# Patient Record
Sex: Male | Born: 1935
Health system: Southern US, Community
[De-identification: ages and names within clinical notes are randomized; demographics above are authoritative.]

## PROBLEM LIST (undated history)

## (undated) DIAGNOSIS — I5022 Chronic systolic (congestive) heart failure: Secondary | ICD-10-CM

## (undated) DIAGNOSIS — Z95810 Presence of automatic (implantable) cardiac defibrillator: Secondary | ICD-10-CM

## (undated) DIAGNOSIS — I10 Essential (primary) hypertension: Secondary | ICD-10-CM

## (undated) DIAGNOSIS — E785 Hyperlipidemia, unspecified: Secondary | ICD-10-CM

## (undated) DIAGNOSIS — N189 Chronic kidney disease, unspecified: Secondary | ICD-10-CM

## (undated) DIAGNOSIS — I2510 Atherosclerotic heart disease of native coronary artery without angina pectoris: Secondary | ICD-10-CM

## (undated) DIAGNOSIS — I513 Intracardiac thrombosis, not elsewhere classified: Secondary | ICD-10-CM

## (undated) DIAGNOSIS — Z950 Presence of cardiac pacemaker: Secondary | ICD-10-CM

## (undated) DIAGNOSIS — I472 Ventricular tachycardia, unspecified: Secondary | ICD-10-CM

## (undated) DIAGNOSIS — I255 Ischemic cardiomyopathy: Secondary | ICD-10-CM

## (undated) HISTORY — DX: Ventricular tachycardia: I47.2

## (undated) HISTORY — DX: Presence of cardiac pacemaker: Z95.0

## (undated) HISTORY — DX: Presence of automatic (implantable) cardiac defibrillator: Z95.810

## (undated) HISTORY — PX: ANGIOPLASTY / STENTING FEMORAL: SUR30

## (undated) HISTORY — DX: Hyperlipidemia, unspecified: E78.5

## (undated) HISTORY — DX: Atherosclerotic heart disease of native coronary artery without angina pectoris: I25.10

## (undated) HISTORY — DX: Chronic systolic (congestive) heart failure: I50.22

## (undated) HISTORY — PX: CARDIAC DEFIBRILLATOR PLACEMENT: SHX171

## (undated) HISTORY — DX: Ventricular tachycardia, unspecified: I47.20

## (undated) HISTORY — DX: Ischemic cardiomyopathy: I25.5

## (undated) HISTORY — DX: Chronic kidney disease, unspecified: N18.9

## (undated) HISTORY — DX: Essential (primary) hypertension: I10

---

## 1997-10-20 ENCOUNTER — Other Ambulatory Visit: Admission: RE | Admit: 1997-10-20 | Discharge: 1997-10-20 | Payer: Self-pay | Admitting: Urology

## 1999-07-05 ENCOUNTER — Inpatient Hospital Stay (HOSPITAL_COMMUNITY): Admission: AD | Admit: 1999-07-05 | Discharge: 1999-07-08 | Payer: Self-pay | Admitting: Cardiology

## 1999-07-05 ENCOUNTER — Encounter: Payer: Self-pay | Admitting: Cardiology

## 1999-08-21 ENCOUNTER — Inpatient Hospital Stay (HOSPITAL_COMMUNITY): Admission: EM | Admit: 1999-08-21 | Discharge: 1999-08-24 | Payer: Self-pay | Admitting: Internal Medicine

## 1999-08-22 ENCOUNTER — Encounter: Payer: Self-pay | Admitting: Internal Medicine

## 2000-01-22 ENCOUNTER — Inpatient Hospital Stay (HOSPITAL_COMMUNITY): Admission: EM | Admit: 2000-01-22 | Discharge: 2000-01-24 | Payer: Self-pay | Admitting: Emergency Medicine

## 2000-01-22 ENCOUNTER — Encounter: Payer: Self-pay | Admitting: Emergency Medicine

## 2000-01-24 ENCOUNTER — Encounter: Payer: Self-pay | Admitting: Cardiology

## 2003-09-29 ENCOUNTER — Inpatient Hospital Stay (HOSPITAL_COMMUNITY): Admission: RE | Admit: 2003-09-29 | Discharge: 2003-09-30 | Payer: Self-pay | Admitting: Internal Medicine

## 2003-10-05 ENCOUNTER — Inpatient Hospital Stay (HOSPITAL_COMMUNITY): Admission: RE | Admit: 2003-10-05 | Discharge: 2003-10-06 | Payer: Self-pay | Admitting: Internal Medicine

## 2004-02-10 ENCOUNTER — Ambulatory Visit: Payer: Self-pay | Admitting: Internal Medicine

## 2005-03-27 ENCOUNTER — Ambulatory Visit: Payer: Self-pay | Admitting: Internal Medicine

## 2005-05-25 IMAGING — CR DG CHEST 2V
2 series · 2 of 2 positions shown · non-contrast
Comparison: 09/30/03.

CLINICAL DATA: 69 year-old male status post AICD lead replacement
TWO VIEW CHEST 10/06/03

[view not recorded (1 of 2)]
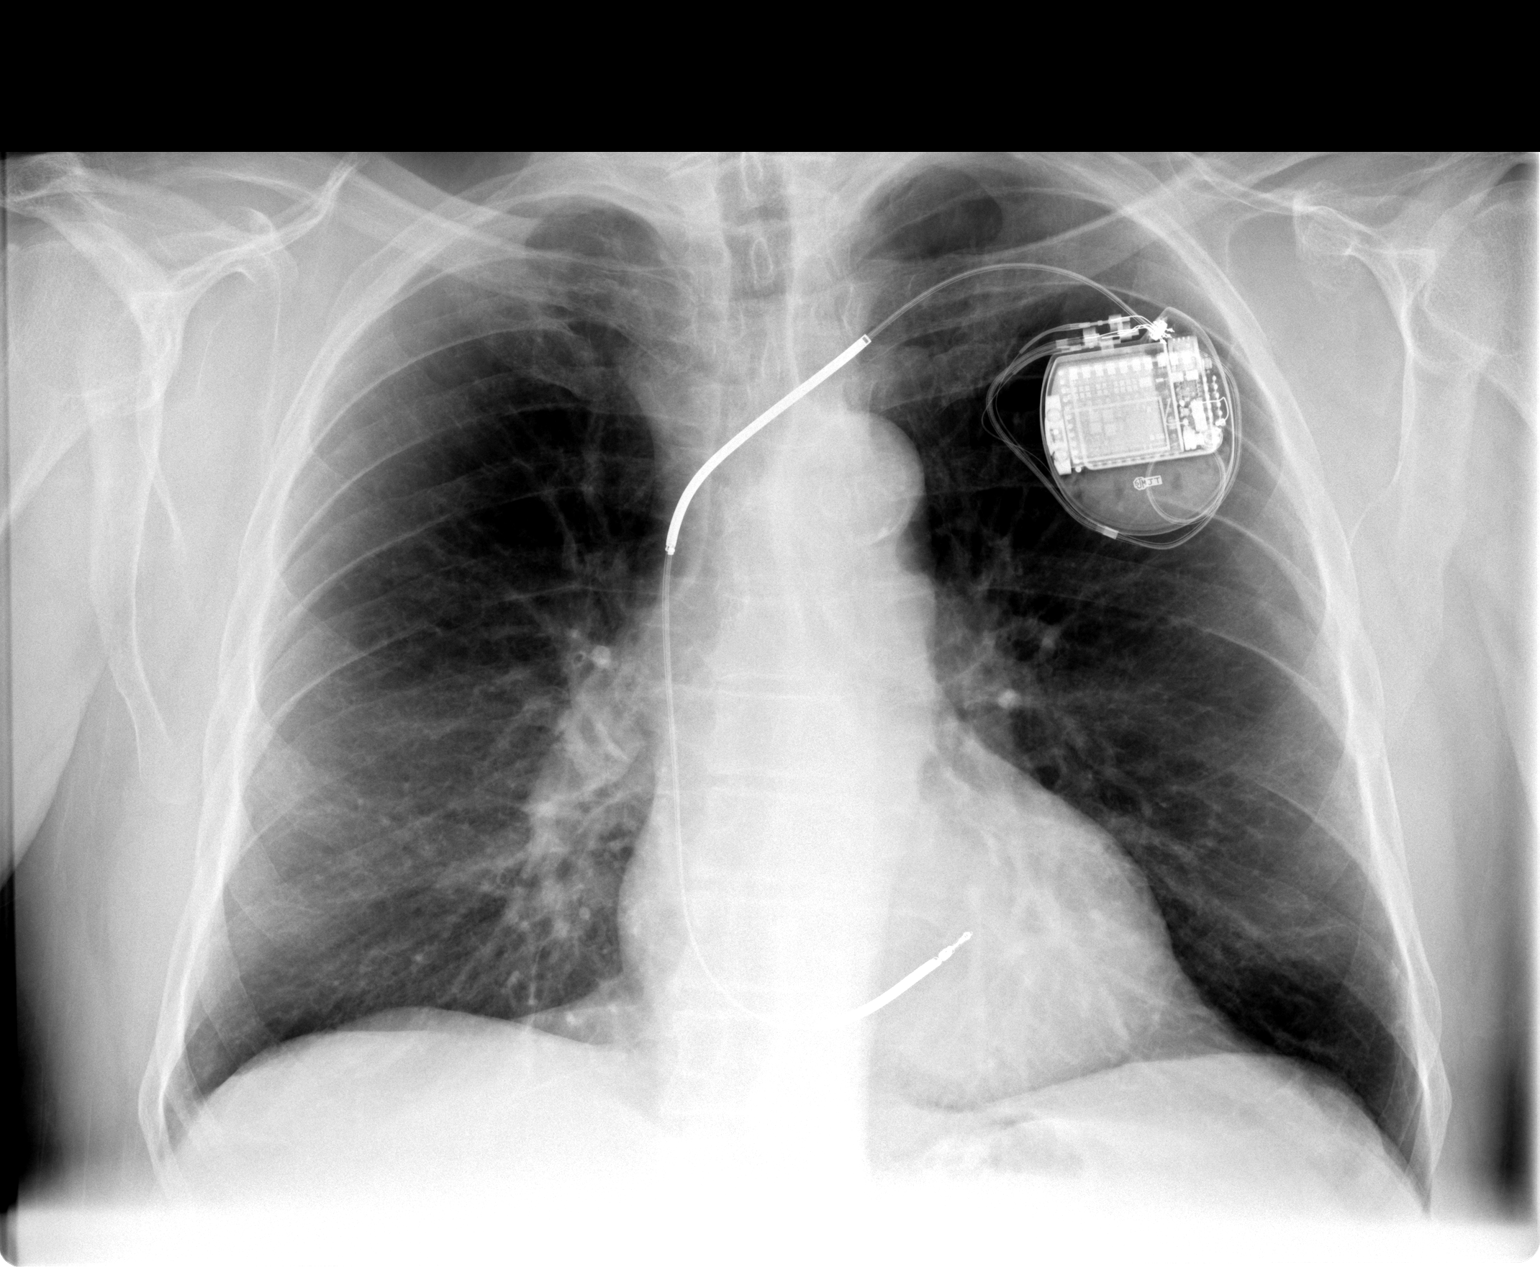

[view not recorded (2 of 2)]
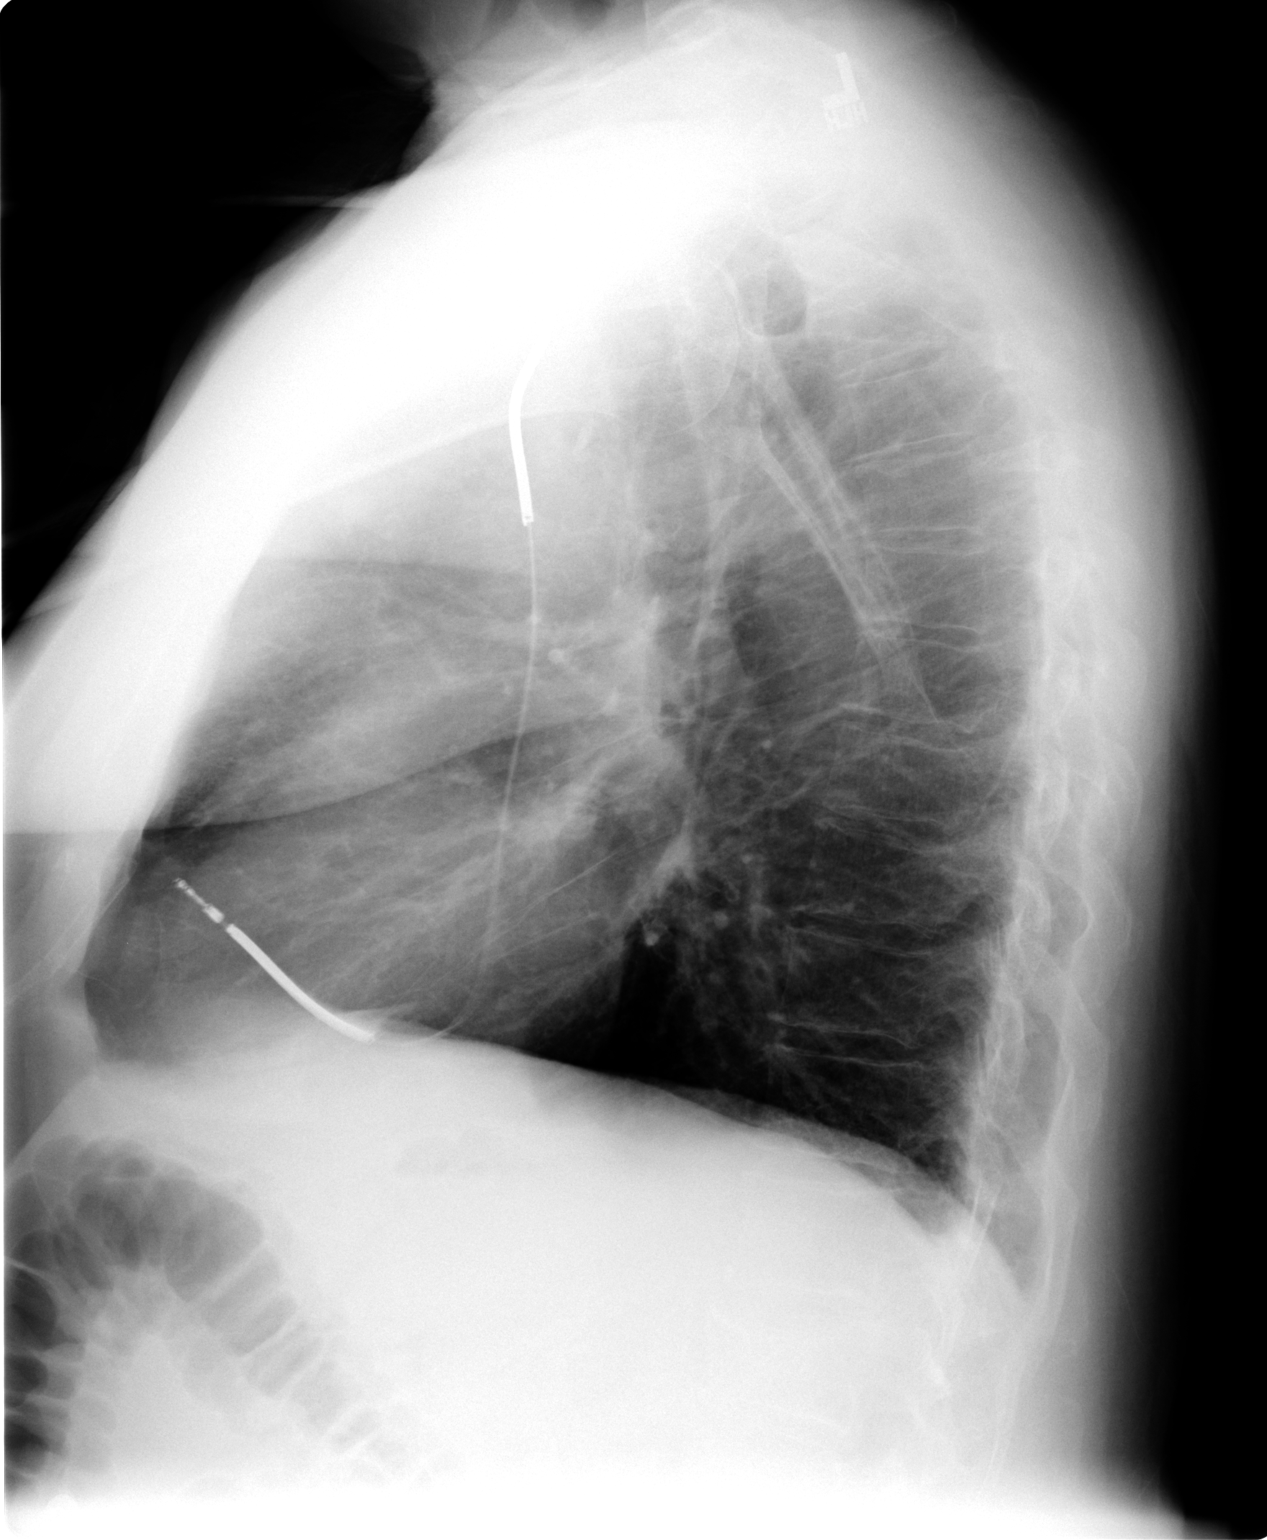

[2 of 2 positions shown; findings below may reference images not displayed]

FINDINGS: The AICD lead has a slightly different course with the single lead  projecting more superiorly over the right ventricle.  The lead tip is no longer in the right ventricular apex.  No pneumothorax, CHF, effusion or cardiac enlargement.  
IMPRESSION
Exchange of apparent AICD lead with the tip projecting more superiorly over the right ventricular region.  
No pneumothorax or acute finding.

## 2005-06-08 ENCOUNTER — Ambulatory Visit: Payer: Self-pay | Admitting: Internal Medicine

## 2005-12-25 ENCOUNTER — Ambulatory Visit: Payer: Self-pay | Admitting: Internal Medicine

## 2006-04-04 ENCOUNTER — Ambulatory Visit: Payer: Self-pay | Admitting: Internal Medicine

## 2006-06-27 ENCOUNTER — Ambulatory Visit: Payer: Self-pay | Admitting: Internal Medicine

## 2006-10-01 ENCOUNTER — Ambulatory Visit: Payer: Self-pay | Admitting: Internal Medicine

## 2006-10-04 ENCOUNTER — Ambulatory Visit: Payer: Self-pay | Admitting: Internal Medicine

## 2006-10-04 LAB — CONVERTED CEMR LAB
Basophils Absolute: 0 10*3/uL (ref 0.0–0.1)
Basophils Relative: 0.1 % (ref 0.0–1.0)
Creatinine, Ser: 1.4 mg/dL (ref 0.4–1.5)
GFR calc Af Amer: 64 mL/min
Glucose, Bld: 111 mg/dL — ABNORMAL HIGH (ref 70–99)
MCHC: 34.5 g/dL (ref 30.0–36.0)
MCV: 91.7 fL (ref 78.0–100.0)
Monocytes Relative: 12.6 % — ABNORMAL HIGH (ref 3.0–11.0)
Potassium: 4.5 meq/L (ref 3.5–5.1)
RBC: 4.54 M/uL (ref 4.22–5.81)
RDW: 12.9 % (ref 11.5–14.6)
Sodium: 141 meq/L (ref 135–145)
aPTT: 25.8 s (ref 21.7–29.8)

## 2006-10-09 ENCOUNTER — Inpatient Hospital Stay (HOSPITAL_BASED_OUTPATIENT_CLINIC_OR_DEPARTMENT_OTHER): Admission: RE | Admit: 2006-10-09 | Discharge: 2006-10-09 | Payer: Self-pay | Admitting: Cardiology

## 2006-10-09 ENCOUNTER — Ambulatory Visit: Payer: Self-pay | Admitting: Cardiology

## 2006-10-28 ENCOUNTER — Ambulatory Visit: Payer: Self-pay | Admitting: Internal Medicine

## 2006-11-08 ENCOUNTER — Ambulatory Visit: Payer: Self-pay

## 2007-03-07 DIAGNOSIS — I252 Old myocardial infarction: Secondary | ICD-10-CM | POA: Insufficient documentation

## 2007-07-22 ENCOUNTER — Ambulatory Visit: Payer: Self-pay | Admitting: Internal Medicine

## 2007-11-05 ENCOUNTER — Ambulatory Visit: Payer: Self-pay | Admitting: Internal Medicine

## 2008-02-02 ENCOUNTER — Ambulatory Visit: Payer: Self-pay | Admitting: Internal Medicine

## 2008-03-09 ENCOUNTER — Encounter: Payer: Self-pay | Admitting: Internal Medicine

## 2008-05-03 ENCOUNTER — Ambulatory Visit: Payer: Self-pay | Admitting: Internal Medicine

## 2008-07-30 DIAGNOSIS — I255 Ischemic cardiomyopathy: Secondary | ICD-10-CM | POA: Insufficient documentation

## 2008-07-30 DIAGNOSIS — I10 Essential (primary) hypertension: Secondary | ICD-10-CM | POA: Insufficient documentation

## 2008-08-02 ENCOUNTER — Ambulatory Visit: Payer: Self-pay | Admitting: Internal Medicine

## 2008-08-02 DIAGNOSIS — Z95810 Presence of automatic (implantable) cardiac defibrillator: Secondary | ICD-10-CM | POA: Insufficient documentation

## 2008-11-01 ENCOUNTER — Ambulatory Visit: Payer: Self-pay | Admitting: Internal Medicine

## 2008-11-11 ENCOUNTER — Encounter: Payer: Self-pay | Admitting: Internal Medicine

## 2009-01-31 ENCOUNTER — Encounter: Payer: Self-pay | Admitting: Internal Medicine

## 2009-02-08 ENCOUNTER — Encounter: Payer: Self-pay | Admitting: Internal Medicine

## 2009-05-02 ENCOUNTER — Ambulatory Visit: Payer: Self-pay | Admitting: Internal Medicine

## 2009-05-11 ENCOUNTER — Encounter: Payer: Self-pay | Admitting: Internal Medicine

## 2009-06-30 ENCOUNTER — Telehealth (INDEPENDENT_AMBULATORY_CARE_PROVIDER_SITE_OTHER): Payer: Self-pay | Admitting: *Deleted

## 2009-08-02 ENCOUNTER — Ambulatory Visit: Payer: Self-pay | Admitting: Internal Medicine

## 2009-09-09 ENCOUNTER — Telehealth (INDEPENDENT_AMBULATORY_CARE_PROVIDER_SITE_OTHER): Payer: Self-pay | Admitting: *Deleted

## 2009-09-30 ENCOUNTER — Ambulatory Visit: Payer: Self-pay | Admitting: Internal Medicine

## 2009-09-30 ENCOUNTER — Telehealth: Payer: Self-pay | Admitting: Internal Medicine

## 2009-10-24 ENCOUNTER — Encounter: Payer: Self-pay | Admitting: Internal Medicine

## 2009-11-17 ENCOUNTER — Ambulatory Visit: Payer: Self-pay | Admitting: Internal Medicine

## 2009-11-17 ENCOUNTER — Telehealth: Payer: Self-pay | Admitting: Internal Medicine

## 2009-11-23 ENCOUNTER — Encounter: Payer: Self-pay | Admitting: Internal Medicine

## 2010-02-16 ENCOUNTER — Encounter: Payer: Self-pay | Admitting: Internal Medicine

## 2010-02-16 ENCOUNTER — Ambulatory Visit
Admission: RE | Admit: 2010-02-16 | Discharge: 2010-02-16 | Payer: Self-pay | Source: Home / Self Care | Attending: Internal Medicine | Admitting: Internal Medicine

## 2010-03-05 ENCOUNTER — Encounter (INDEPENDENT_AMBULATORY_CARE_PROVIDER_SITE_OTHER): Payer: Self-pay | Admitting: *Deleted

## 2010-03-07 NOTE — Progress Notes (Signed)
Summary: DEVICE/ TRANSMISSION  Phone Note Call from Patient   Caller: Spouse/ZONIA Reason for Call: Talk to Nurse Summary of Call: PT WIFE ZONIA STATES PT IS HAVING TROUBLE WITH DEVICE remote transmission.  Initial call taken by: Roe Coombs,  November 17, 2009 10:11 AM  Additional Follow-up for Phone Call Additional follow up Details #1::        spoke w/Zonia ---device connecting to pt just not sending thru.  gave [facility] phone # to pt to call and troubleshoot. Vella Kohler  November 17, 2009 1:02 PM

## 2010-03-07 NOTE — Cardiovascular Report (Signed)
Summary: Office Visit Remote   Office Visit Remote   Imported By: Roderic Ovens 02/10/2009 12:43:49  _____________________________________________________________________  External Attachment:    Type:   Image     Comment:   External Document

## 2010-03-07 NOTE — Letter (Signed)
Summary: Remote Device Check  [employer], Main Office  [address]   [place], [place] [address]   Phone: [phone]  Fax: [phone]     November 23, 2009 MRN: [id number]   [name] [address] [place], [place]  [address]   Dear [name],   Your remote transmission was recieved and reviewed by your physician.  All diagnostics were within normal limits for you.  __X___Your next transmission is scheduled for:  02-16-2010.  Please transmit at any time this day.  If you have a wireless device your transmission will be sent automatically.   Sincerely,  Vella Kohler

## 2010-03-07 NOTE — Cardiovascular Report (Signed)
Summary: Office Visit Remote   Office Visit Remote   Imported By: Roderic Ovens 10/25/2009 13:49:12  _____________________________________________________________________  External Attachment:    Type:   Image     Comment:   External Document

## 2010-03-07 NOTE — Letter (Signed)
Summary: Remote Device Check  [facility] [employer], Main Office  [address]   [place], [place] [address]   Phone: [phone]  Fax: [phone]     May 11, 2009 MRN: [id number]   [facility] [address] [place], [place]  [address]   Dear Mr. BILYEU,   Your remote transmission was recieved and reviewed by your physician.  All diagnostics were within normal limits for you.    __X____Your next office visit is scheduled for:  JUNE 2011 WITH DR Ladona Ridgel. Please call our office to schedule an appointment.    Sincerely,  Proofreader

## 2010-03-07 NOTE — Letter (Signed)
Summary: Remote Device Check  [facility], Main Office  [address]   [place], [place] [address]   Phone: [phone]  Fax: [phone]     October 24, 2009 MRN: [id number]   DELL BRINER [address] [place], [place]  [address]   Dear Mr. BRAHMBHATT,   Your remote transmission was recieved and reviewed by your physician.  All diagnostics were within normal limits for you.  __X___Your next transmission is scheduled for:  11-17-2009.  Please transmit at any time this day.  If you have a wireless device your transmission will be sent automatically.   Sincerely,  Vella Kohler

## 2010-03-07 NOTE — Progress Notes (Signed)
Summary: trouble with phone transmission  Phone Note Call from Patient   Caller: Patient [phone] Reason for Call: Talk to Nurse Summary of Call: pt had trouble getting phone transmission to work-called the 800 number and was told a tech was not availble to help-pls call [phone] Initial call taken by: Glynda Jaeger,  September 30, 2009 9:09 AM  Additional Follow-up for Phone Call Additional follow up Details #1::        SPOKE W/PT'S WIFE.  THOUGHT THE TRANSMISSION HAD TO BE SENT AT A CERTAIN TIME.  INSTRUCTED PT'S WIFE IT COULD BE SENT ANYTIME.  PT'S WIFE TO CALL [facility] B/C THEY TRIED SEVERAL TIMES TO SEND TRANSMISSION.  TRANSMISSION WAS NEVER RECEIVED.  Vella Kohler  September 30, 2009 9:51 AM

## 2010-03-07 NOTE — Progress Notes (Signed)
Summary: callling re transmitter not received   Phone Note Call from Patient   Caller: Patient Reason for Call: Talk to Nurse Summary of Call: pt calling to say dr taylor was going to send him a transmiiter and he has not gotten it yet-pls call [phone] Initial call taken by: Glynda Jaeger,  September 09, 2009 10:07 AM  Follow-up for Phone Call        Transmitter reordered, patient aware. Follow-up by: Altha Harm, LPN,  September 09, 2009 4:47 PM

## 2010-03-07 NOTE — Assessment & Plan Note (Signed)
Summary: defib check.[email or web address]   Visit Type:  Follow-up   History of Present Illness: [name] returns today for followup.  He is a 76 yo man with a h/o CAD, s/p MI, h/o CHF (class 2).  He underwent ICD implant in 2005. He has done well.  He denies sob.  No peripheral edema or intercurrent ICD shocks.  Several days ago he experienced some chest pain which was not related to exertion.  It lasted about 20 minutes.  He took a ntg which was old.  Unclear whether this helped or not.  No other complaints today.  Current Medications (verified): 1)  Carvedilol 3.125 Mg Tabs (Carvedilol) .... Take One Tablet By Mouth Twice A Day 2)  Aspirin Low Dose 81 Mg  Tabs (Aspirin) .... Take 1 Tablet By Mouth Once A Day 3)  Pravastatin Sodium 20 Mg Tabs (Pravastatin Sodium) .... Take One Tablet By Mouth Daily At Bedtime 4)  Niaspan 1000 Mg  Tbcr (Niacin (Antihyperlipidemic)) .... Take 1 Tablet By Mouth Once A Day At Bedtime 5)  Lisinopril 20 Mg  Tabs (Lisinopril) .... Take 1 Tablet By Mouth Once Daily 6)  Nitrostat 0.4 Mg Subl (Nitroglycerin) .Marland Kitchen.. 1 Tablet Under Tongue At Onset of Chest Pain; You May Repeat Every 5 Minutes For Up To 3 Doses.  Allergies (verified): No Known Drug Allergies  Past History:  Past Medical History: Last updated: 07/30/2008 HYPERTENSION (ICD-401.9) CARDIOMYOPATHY, ISCHEMIC (ICD-414.8) PACEMAKER, PERMANENT (ICD-V45.01) CONGESTIVE HEART FAILURE UNSPECIFIED (ICD-428.0) Hx of MYOCARDIAL INFARCTION (ICD-410.90)  Past Surgical History: Last updated: 03/07/2007 Angioplasty and stenting  Review of Systems  The patient denies chest pain, syncope, dyspnea on exertion, and peripheral edema.    Vital Signs:  Patient profile:   76 year old male Height:      69 inches Weight:      192 pounds BMI:     28.46 Pulse rate:   45 / minute BP sitting:   90 / 70  (left arm)  Vitals Entered By: [name] CMA (August 02, 2009 3:16 PM)  Physical Exam  General:  Well developed, well  nourished, in no acute distress. Head:  normocephalic and atraumatic Eyes:  PERRLA/EOM intact; conjunctiva and lids normal. Mouth:  Teeth, gums and palate normal. Oral mucosa normal. Neck:  Neck supple, no JVD. No masses, thyromegaly or abnormal cervical nodes. Chest Wall:  Well healed ICD incision. Lungs:  Clear bilaterally to auscultation with no wheezes, rales or rhonchi. Heart:  RRR with normal S1 and S2.  PMI is enlarged and laterally displaced.  No murmurs. Abdomen:  Bowel sounds positive; abdomen soft and non-tender without masses, organomegaly, or hernias noted. No hepatosplenomegaly. Msk:  Back normal, normal gait. Muscle strength and tone normal. Pulses:  pulses normal in all 4 extremities Extremities:  No clubbing or cyanosis. Neurologic:  Alert and oriented x 3.    ICD Specifications Following MD:  Lewayne Bunting, MD     ICD Vendor:  [facility] [employer]     ICD Model Number:  [id number]     ICD Serial Number:  [id number] ICD DOI:  09/29/2003     ICD Implanting MD:  Lewayne Bunting, MD  Lead 1:    Location: RV     DOI: 10/05/2003     Model #: [id number]     Serial #: [id number]     Status: active  Indications::  ICM   ICD Follow Up Remote Check?  No Battery Voltage:  2.54 V     Charge Time:  13.6  seconds     Underlying rhythm:  SR WITH FREQ PVC'S ICD Dependent:  No       ICD Device Measurements Right Ventricle:  Amplitude: 12 mV, Impedance: 510 ohms, Threshold: 0.5 V at 0.5 msec Shock Impedance: 41 ohms   Episodes Shock:  0     ATP:  1     Nonsustained:  0     Ventricular Pacing:  <1%  Brady Parameters Mode VVI     Lower Rate Limit:  40      Tachy Zones VF:  222     VT:  200     VT1:  171     Next Remote Date:  09/29/2009     Next Cardiology Appt Due:  07/07/2010 Tech Comments:  Normal device function.  Battery voltage <2.55, will start every 8 week checks.  Pt aware of issue with rapid battery depletion.  ATP episode was from June of 2010 and was seen on previous [facility] transmissions.  ROV 12 months  GT. Gypsy Balsam RN BSN  August 02, 2009 3:43 PM  MD Comments:  Agree with above.  Impression & Recommendations:  Problem # 1:  AUTOMATIC IMPLANTABLE CARDIAC DEFIBRILLATOR SITU (ICD-V45.02) His device is working normally but is approaching ERI. I will see him back in several months.  Problem # 2:  CARDIOMYOPATHY, ISCHEMIC (ICD-414.8) The patient describes an episode of non0-exertional chest pain several days ago.  He remains active and does not have pain while working.  I have asked him to watch this and we have given him a prescription for ntg.  He will call if this continues. His updated medication list for this problem includes:    Carvedilol 3.125 Mg Tabs (Carvedilol) .Marland Kitchen... Take one tablet by mouth twice a day    Aspirin Low Dose 81 Mg Tabs (Aspirin) .Marland Kitchen... Take 1 tablet by mouth once a day    Lisinopril 20 Mg Tabs (Lisinopril) .Marland Kitchen... Take 1 tablet by mouth once daily    Nitrostat 0.4 Mg Subl (Nitroglycerin) .Marland Kitchen... 1 tablet under tongue at onset of chest pain; you may repeat every 5 minutes for up to 3 doses.  Problem # 3:  HYPERTENSION (ICD-401.9) His blood pressure is well controlled.  Continue meds as noted below. His updated medication list for this problem includes:    Carvedilol 3.125 Mg Tabs (Carvedilol) .Marland Kitchen... Take one tablet by mouth twice a day    Aspirin Low Dose 81 Mg Tabs (Aspirin) .Marland Kitchen... Take 1 tablet by mouth once a day    Lisinopril 20 Mg Tabs (Lisinopril) .Marland Kitchen... Take 1 tablet by mouth once daily  Patient Instructions: 1)  You are scheduled for a device check from home on September 29, 2009. You may send your transmission at any time that day. If you have a wireless device, the transmission will be sent automatically. After your physician reviews your transmission, you will receive a postcard with your next transmission date. 2)  Your physician wants you to follow-up in:12 months with Dr Ladona Ridgel.   You will receive a reminder letter in the mail two months in advance. If you don't  receive a letter, please call our office to schedule the follow-up appointment. Prescriptions: NITROSTAT 0.4 MG SUBL (NITROGLYCERIN) 1 tablet under tongue at onset of chest pain; you may repeat every 5 minutes for up to 3 doses.  #25 x 1   Entered by:   [name] CMA   Authorized by:   Laren Boom, MD, [facility]   Signed  by:   [name] CMA on 08/02/2009   Method used:   Electronically to        [facility] [id number] (retail)       [address]       [place], [place]  [address]       Ph: [phone]       Fax: [phone]   RxID:   [id number]

## 2010-03-07 NOTE — Cardiovascular Report (Signed)
Summary: Office Visit   Office Visit   Imported By: Roderic Ovens 08/16/2009 09:57:51  _____________________________________________________________________  External Attachment:    Type:   Image     Comment:   External Document

## 2010-03-07 NOTE — Cardiovascular Report (Signed)
Summary: Office Visit Remote   Office Visit Remote   Imported By: Roderic Ovens 05/13/2009 14:56:39  _____________________________________________________________________  External Attachment:    Type:   Image     Comment:   External Document

## 2010-03-07 NOTE — Progress Notes (Signed)
Summary: Question about pt using a chain saw  Phone Note Call from Patient Call back at Home Phone [phone]   Caller: Spouse/Zonia Summary of Call: Pt wife caling with questions about pt using a chain saw Initial call taken by: Judie Grieve,  Jun 30, 2009 12:47 PM  Follow-up for Phone Call        Spoke with wife, and was advised not to use the chainsaw. Follow-up by: Altha Harm, LPN,  Jun 30, 2009 4:44 PM

## 2010-03-07 NOTE — Letter (Signed)
Summary: Remote Device Check  [facility], Main Office  [address]   [place], [place] [address]   Phone: [phone]  Fax: [phone]     February 08, 2009 MRN: [id number]   [facility] [address] [place], [place]  [address]   Dear Mr. [name],   Your remote transmission was recieved and reviewed by your physician.  All diagnostics were within normal limits for you.  __X___Your next transmission is scheduled for:     May 02, 2009.  Please transmit at any time this day.  If you have a wireless device your transmission will be sent automatically.  ______Your next office visit is scheduled for:                              . Please call our office to schedule an appointment.    Sincerely,  Proofreader

## 2010-03-10 NOTE — Cardiovascular Report (Signed)
Summary: Office Visit Remote   Office Visit Remote   Imported By: Roderic Ovens 11/24/2009 13:30:44  _____________________________________________________________________  External Attachment:    Type:   Image     Comment:   External Document

## 2010-03-15 NOTE — Cardiovascular Report (Signed)
Summary: Office Visit Remote   Office Visit Remote   Imported By: Roderic Ovens 03/06/2010 12:14:51  _____________________________________________________________________  External Attachment:    Type:   Image     Comment:   External Document

## 2010-03-15 NOTE — Letter (Signed)
Summary: Remote Device Check  [facility], Main Office  [address]   [place], [place] [address]   Phone: [phone]  Fax: [phone]     March 05, 2010 MRN: [id number]   [name] [address] [place], [place]  [address]   Dear Mr. [name],   Your remote transmission was recieved and reviewed by your physician.  All diagnostics were within normal limits for you.  __X___Your next transmission is scheduled for:  03-23-2010.  Please transmit at any time this day.  If you have a wireless device your transmission will be sent automatically.   Sincerely,  Vella Kohler

## 2010-03-24 ENCOUNTER — Encounter: Payer: Self-pay | Admitting: Internal Medicine

## 2010-03-24 DIAGNOSIS — I428 Other cardiomyopathies: Secondary | ICD-10-CM

## 2010-03-27 ENCOUNTER — Encounter (INDEPENDENT_AMBULATORY_CARE_PROVIDER_SITE_OTHER): Payer: Self-pay | Admitting: *Deleted

## 2010-04-04 NOTE — Letter (Signed)
Summary: Device-Delinquent Phone Journal[facility], newspaper, Main Office  [address]   [place], [place] [address]   Phone: [phone]  Fax: [phone]     March 27, 2010 MRN: [id number]   [name] [address] [place], [place]  [address]   Dear [name],  According to our records, you were scheduled for a device phone transmission on 03-23-2010.     We did not receive any results from this check.  If you transmitted on your scheduled day, please call us to help troubleshoot your system.  If you forgot to send your transmission, please send one upon receipt of this letter.  Thank you,   Architectural technologist [facility]

## 2010-04-11 ENCOUNTER — Encounter: Payer: Self-pay | Admitting: Internal Medicine

## 2010-04-25 ENCOUNTER — Encounter: Payer: Self-pay | Admitting: Internal Medicine

## 2010-04-25 ENCOUNTER — Ambulatory Visit (INDEPENDENT_AMBULATORY_CARE_PROVIDER_SITE_OTHER): Payer: Medicare Other | Admitting: Internal Medicine

## 2010-04-25 ENCOUNTER — Encounter: Payer: Self-pay | Admitting: *Deleted

## 2010-04-25 VITALS — BP 130/80 | HR 64 | Ht 68.0 in | Wt 191.0 lb

## 2010-04-25 DIAGNOSIS — Z95810 Presence of automatic (implantable) cardiac defibrillator: Secondary | ICD-10-CM

## 2010-04-25 DIAGNOSIS — I255 Ischemic cardiomyopathy: Secondary | ICD-10-CM

## 2010-04-25 DIAGNOSIS — I472 Ventricular tachycardia: Secondary | ICD-10-CM

## 2010-04-25 DIAGNOSIS — Z79899 Other long term (current) drug therapy: Secondary | ICD-10-CM

## 2010-04-25 DIAGNOSIS — I2589 Other forms of chronic ischemic heart disease: Secondary | ICD-10-CM

## 2010-04-25 NOTE — Patient Instructions (Signed)

## 2010-04-25 NOTE — Assessment & Plan Note (Signed)
His blood pressure is well controlled. I have asked him to continue a low sodium diet.

## 2010-04-25 NOTE — Progress Notes (Signed)
HPI [name] returns today for followup. He is a pleasant 76 yo man with VT, CHF, and dyslipidemia. He denies any ICD shocks and his CHF remains class 2. No syncope. No peripheral edema or c/p.  No Known Allergies   Current Outpatient Prescriptions  Medication Sig Dispense Refill  . aspirin 81 MG tablet Take 81 mg by mouth daily.        . carvedilol (COREG) 3.125 MG tablet Take 3.125 mg by mouth 2 (two) times daily with a meal.        . Cholecalciferol (VITAMIN D-3 PO) Take by mouth daily.        Marland Kitchen lisinopril (PRINIVIL,ZESTRIL) 20 MG tablet Take 20 mg by mouth daily.        . niacin (NIASPAN) 1000 MG CR tablet Take 1,000 mg by mouth at bedtime.        . nitroGLYCERIN (NITROSTAT) 0.4 MG SL tablet Place 0.4 mg under the tongue every 5 (five) minutes as needed.        . pravastatin (PRAVACHOL) 20 MG tablet Take 20 mg by mouth daily.           Past Medical History  Diagnosis Date  . HTN (hypertension)   . Cardiomyopathy, ischemic   . Presence of permanent cardiac pacemaker   . CHF (congestive heart failure)   . Myocardial infarction     ROS:   All systems reviewed and negative except as noted in the HPI.   Past Surgical History  Procedure Date  . Angioplasty / stenting femoral   . Cardiac defibrillator placement     ICD-St Jude     No family history on file.   History   Social History  . Marital Status: Married    Spouse Name: N/A    Number of Children: N/A  . Years of Education: N/A   Occupational History  . Not on file.   Social History Main Topics  . Smoking status: Former Games developer  . Smokeless tobacco: Not on file  . Alcohol Use: Not on file  . Drug Use: Not on file  . Sexually Active: Not on file   Other Topics Concern  . Not on file   Social History Narrative  . No narrative on file     BP 130/80  Pulse 64  Ht 5\' 8"  (1.727 m)  Wt 191 lb (86.637 kg)  BMI 29.04 kg/m2  Physical Exam:  Well appearing NAD HEENT: Unremarkable Neck:  No JVD,  no thyromegally Lymphatics:  No adenopathy Back:  No CVA tenderness Lungs:  Clear. Well healed ICD incision. HEART:  IRegular rate rhythm, no murmurs, no rubs, no clicks Abd:  Flat, positive bowel sounds, no organomegally, no rebound, no guarding Ext:  2 plus pulses, no edema, no cyanosis, no clubbing Skin:  No rashes no nodules Neuro:  CN II through XII intact, motor grossly intac  DEVICE  Normal device function.  See PaceArt for details.   Assess/Plan:

## 2010-04-25 NOTE — Assessment & Plan Note (Signed)
His symptoms remain class 2. I have asked him to maintain a low sodium diet and continue his current meds.

## 2010-04-25 NOTE — Assessment & Plan Note (Signed)
His device is working normally but he is at [employer]. I have discussed the risks/benefits/goals/expectations of ICD generator change with the patient and he wishes to proceed.

## 2010-04-25 NOTE — Assessment & Plan Note (Signed)
He has had two episodes of VT with successful ATP. These were asymptomatic.

## 2010-05-01 ENCOUNTER — Other Ambulatory Visit (INDEPENDENT_AMBULATORY_CARE_PROVIDER_SITE_OTHER): Payer: Medicare Other | Admitting: *Deleted

## 2010-05-01 DIAGNOSIS — I428 Other cardiomyopathies: Secondary | ICD-10-CM

## 2010-05-01 DIAGNOSIS — I509 Heart failure, unspecified: Secondary | ICD-10-CM

## 2010-05-01 LAB — CBC WITH DIFFERENTIAL/PLATELET
Basophils Relative: 0.3 % (ref 0.0–3.0)
Eosinophils Absolute: 0.6 10*3/uL (ref 0.0–0.7)
Eosinophils Relative: 8.3 % — ABNORMAL HIGH (ref 0.0–5.0)
HCT: 42.5 % (ref 39.0–52.0)
MCHC: 34 g/dL (ref 30.0–36.0)
Monocytes Absolute: 1 10*3/uL (ref 0.1–1.0)
Monocytes Relative: 13.4 % — ABNORMAL HIGH (ref 3.0–12.0)
RBC: 4.5 Mil/uL (ref 4.22–5.81)
RDW: 13.2 % (ref 11.5–14.6)

## 2010-05-01 LAB — BASIC METABOLIC PANEL
BUN: 15 mg/dL (ref 6–23)
Chloride: 98 mEq/L (ref 96–112)

## 2010-05-04 NOTE — Cardiovascular Report (Signed)
Summary: Office Visit   Office Visit   Imported By: Roderic Ovens 04/24/2010 11:27:45  _____________________________________________________________________  External Attachment:    Type:   Image     Comment:   External Document

## 2010-05-08 ENCOUNTER — Ambulatory Visit (HOSPITAL_COMMUNITY)
Admission: RE | Admit: 2010-05-08 | Discharge: 2010-05-08 | Disposition: A | Discharge status: Home / Self Care | Payer: Medicare Other | Source: Ambulatory Visit | Attending: Internal Medicine | Admitting: Internal Medicine

## 2010-05-08 DIAGNOSIS — Z4502 Encounter for adjustment and management of automatic implantable cardiac defibrillator: Secondary | ICD-10-CM | POA: Insufficient documentation

## 2010-05-08 DIAGNOSIS — I472 Ventricular tachycardia: Secondary | ICD-10-CM

## 2010-05-12 NOTE — Op Note (Signed)
  NAME:  [name], [name] NO.:  [id number]  MEDICAL RECORD NO.:  [id number]           PATIENT TYPE:  O  LOCATION:  [facility]                         FACILITY:  [facility]  PHYSICIAN:  Doylene Canning. Ladona Ridgel, MD    DATE OF BIRTH:  09/12/1934  DATE OF PROCEDURE:  05/08/2010 DATE OF DISCHARGE:  05/08/2010                              OPERATIVE REPORT   PROCEDURE PERFORMED:  Removal of previously implanted single chamber defibrillator, insertion of new device as his initial device had reached elective replacement indication, defibrillation threshold testing was carried out as well.  PROCEDURE IN DETAILS:  After informed consent was obtained, the patient was taken to the diagnostic EP lab in a fasting state.  After usual preparation and draping, intravenous fentanyl and  midazolam was given for sedation.  Lidocaine 30 mL was infiltrated into the left infraclavicular region.  A 7-cm incision was carried out over this region.  Electrocautery was utilized to dissect down to the fascial plane.  Electrocautery was utilized then to enter the pocket.  The device was removed with gentle traction.  The lead was disconnected from the old device.  It was inspected and the R-waves were found to be 10, the impedance was 475, and the pacing threshold was 0.5 volts at 0.5 milliseconds.  With these satisfactory parameters, the pocket was again irrigated with antibiotic irrigation and the new St. Jude single chamber defibrillator, serial number [id number] was connected to the old defibrillation lead and placed back in the subcutaneous pocket.  The high voltage impedance was in the low 50s.  The patient was more deeply sedated and defibrillation threshold testing carried out.  After the patient more deeply sedated with fentanyl and Versed, VF was induced with a T-wave shock.  A 15-joule shock was subsequently delivered which terminated VF and restored sinus rhythm.  No additional defibrillation threshold  testing was carried out and the incision closed with 2-0 and 3-0 Vicryl.  Benzoin and Steri-Strips were painted on the skin.  A pressure dressing was applied.  The patient was returned to his room in satisfactory condition.  COMPLICATIONS:  There were no immediate procedure complications.  RESULTS:  Successful removal of a previously implanted single-chamber defibrillator, insertion of a new device  without immediate procedure complication with defibrillation threshold testing.     Doylene Canning. Ladona Ridgel, MD     GWT/MEDQ  D:  05/08/2010  T:  05/09/2010  Job:  [id number]  Electronically Signed by Lewayne Bunting MD on 05/12/2010 06:48:37 AM

## 2010-05-25 ENCOUNTER — Ambulatory Visit (INDEPENDENT_AMBULATORY_CARE_PROVIDER_SITE_OTHER): Payer: Medicare Other | Admitting: *Deleted

## 2010-05-25 ENCOUNTER — Other Ambulatory Visit: Payer: Self-pay | Admitting: Internal Medicine

## 2010-05-25 DIAGNOSIS — I509 Heart failure, unspecified: Secondary | ICD-10-CM

## 2010-05-25 DIAGNOSIS — I472 Ventricular tachycardia: Secondary | ICD-10-CM

## 2010-05-25 NOTE — Progress Notes (Signed)
Wound check icd change out

## 2010-06-20 NOTE — Assessment & Plan Note (Signed)
[facility]                         ELECTROPHYSIOLOGY OFFICE NOTE   NAME:Mcgraw, JONCARLO FRIBERG                     MRN:          [id number]  DATE:10/28/2006                            DOB:          01/18/1935    Mr. Yohn returns today for followup.  He is a very pleasant elderly  man with a history of ischemic heart disease and ventricular  tachycardia, status post ICD insertion with a history of congestive  heart failure.  He was seen by Dr. Juanda Chance and underwent catheterization  secondary to chest pain, which demonstrated no worsening of his coronary  disease.  He notes that these episodes of chest pain occur for typically  about a minute at a time and are not related to exertion.  He does have  chronic heart failure symptoms.  Otherwise, he is stable.   MEDICATIONS:  1. Carvedilol 6.25 mg twice daily.  2. Aspirin 81 twice daily.  3. Simvastatin 10 mg daily.  4. Niacin 1 gm daily.  5. Lisinopril 20 mg b.i.d.   PHYSICAL EXAMINATION:  He is a pleasant, well-appearing elderly man in  no distress.  Blood pressure 100/70, pulse 70 and regular, respirations 16, weight 198  pounds.  NECK:  No jugular venous distention.  LUNGS:  Clear bilaterally to auscultation.  There are no wheezes, rales  or rhonchi present.  CARDIOVASCULAR:  Regular rate and rhythm with a normal S1 and S2.  EXTREMITIES:  No edema.   IMPRESSION:  1. Ischemic cardiomyopathy.  2. Congestive heart failure.  3. Status post implantable cardioverter/defibrillator insertion.   DISCUSSION:  Overall, Mr. Bidinger is stable.  I suspect the etiology of  his chest pain is related to either esophageal spasm or occasional PVCs.  Would recommend a period of watchful waiting.  Will make no changes in  his medicines today.  He will follow up with me in six months.  At that  time, will check his device.     Doylene Canning. Ladona Ridgel, MD  Electronically Signed    GWT/MedQ  DD: 10/28/2006  DT:  10/28/2006  Job #: [id number]

## 2010-06-20 NOTE — Assessment & Plan Note (Signed)
[facility]                         ELECTROPHYSIOLOGY OFFICE NOTE   NAME:Golomb, OLSON LUCARELLI                     MRN:          [id number]  DATE:10/01/2006                            DOB:          1934-12-02    Mr. Westwood returns today for follow-up.  He is a pleasant, elderly  male with ischemic heart disease and VT, status post ICD insertion, also  with a history of congestive heart failure and dyslipidemia.  The  patient returned today for follow-up and notes that he has over the last  several weeks had increasing fatigue and weakness and becomes much less  active.  He also notes two distinct episodes of chest pain, one  occurring this morning which lasted just a few minutes and a second  episode that occurred several days ago, which was associated with  substernal chest discomfort and not pain but pressure build-up inside  the area of his mid sternum.  He did not seek medical attention and each  of these episodes stopped in 4 or 5 minutes.  They did not occur  suddenly.  They were gradual in onset, gradual in offset.  The patient  denies medical noncompliance.   MEDICATIONS:  1. Carvedilol 12.5 mg one-half tablet twice daily.  2. Aspirin 81 mg a day.  3. Simvastatin 10 mg a day.  4. Lisinopril 40 mg half-tablet daily.  5. Niacin.   PHYSICAL EXAMINATION:  He is a pleasant, well-appearing man in no  distress.  Blood pressure was 112/66, the pulse was 70 and regular, the  respirations were 18.  Weight was 200 pounds.  NECK:  No jugular venous distention.  There was no thyromegaly.  The  trachea was midline.  LUNGS:  Clear bilaterally to auscultation with no wheezes, rales or  rhonchi.  CARDIOVASCULAR:  A regular rate and rhythm with normal S1 and S2.  There  is a soft S4 gallop present.  EXTREMITIES:  No clubbing, cyanosis, or edema.  The pulses were 2+ and  symmetric.   The EKG demonstrated sinus rhythm with right bundle branch block.   IMPRESSION:  1. Ischemic cardiomyopathy.  2. Congestive heart failure.  3. Increasing chest pain worrisome for recurrent anginal symptoms.   DISCUSSION:  I have recommended that because of Mr. Looper' increasing  anginal symptoms, that he undergo repeat catheterization.  It has been  several years since he has been  catheterized and I think there is likely progression of his coronary  disease, and this will be scheduled at the earliest possible convenient  time.     Doylene Canning. Ladona Ridgel, MD  Electronically Signed    GWT/MedQ  DD: 10/01/2006  DT: 10/02/2006  Job #: [id number]   cc:   Dorann Lodge

## 2010-06-20 NOTE — Assessment & Plan Note (Signed)
[facility]                         ELECTROPHYSIOLOGY OFFICE NOTE   NAME:Garvey, KUE FOX                     MRN:          [id number]  DATE:07/22/2007                            DOB:          1934-02-08    HISTORY OF PRESENT ILLNESS:  Mr. Dietze returns today for followup.  He is a very pleasant male with a history of ischemic cardiomyopathy,  congestive heart failure, who is status post ICD insertion.  He returns  today for followup.  He has been stable.  He denies chest pain or  shortness of breath, and otherwise had no specific complaints today.   CURRENT MEDICATIONS:  1. Carvedilol 12.5 mg one half tablet twice daily.  2. Simvastatin 20 mg half tablet daily.  3. Niacin 1 g daily.  4. Lisinopril 40 daily.  5. Aspirin 81 mg 2 tablets daily.   PHYSICAL EXAMINATION:  GENERAL:  He is a pleasant, elderly-appearing  man, in no acute distress.  VITAL SIGNS:  Blood pressure was 123/68, pulse is 64 and regular, and  weight is 200 pounds.  NECK:  No jugular venous distention.  LUNGS:  Clear bilaterally to auscultation.  No wheezes, rales, or  rhonchi are present.  CARDIOVASCULAR:  Regular rate and rhythm.  Normal S1 and S2.  EXTREMITIES:  No edema.   Interrogation of his defibrillator demonstrates a [employer] Epic V-196  implanted in August 2005.  The R-waves were greater than 12.  Lead  impedance 445.  The threshold is 0.5 at 0.5.  The battery voltage was  2.6 volts.   IMPRESSION:  1. Ischemic cardiomyopathy.  2. Congestive heart failure.  3. Dyslipidemia.  4. Hypertension.   DISCUSSION:  Mr. Mccubbins' defibrillator is working normally.  He did  have one episode of VT, which was successfully terminated with anti-  tachycardia pacing at a rate of approximately 175 beats per minute.  We  will continue to watch the patient and follow him up in 1 year.     Doylene Canning. Ladona Ridgel, MD  Electronically Signed    GWT/MedQ  DD: 07/22/2007  DT:  07/23/2007  Job #: [id number]

## 2010-06-20 NOTE — Cardiovascular Report (Signed)
NAME:  [name], [name]              ACCOUNT NO.:  [id number]   MEDICAL RECORD NO.:  [id number]          PATIENT TYPE:  OIB   LOCATION:  1961                         FACILITY:  [facility]   PHYSICIAN:  Everardo Beals. Juanda Chance, MD, FACCDATE OF BIRTH:  03/25/1934   DATE OF PROCEDURE:  10/09/2006  DATE OF DISCHARGE:                            CARDIAC CATHETERIZATION   HISTORY:  [name] is 76 years old and has ischemic cardiomyopathy  and is status post ICD placement.  He was recently seen in the clinic by  Dr. Ladona [name] with symptoms of recent chest pain.  He is scheduled for  evaluation with angiography.  He has had previous PCI with stents placed  to the proximal and distal right coronary artery.  His last intervention  was at the [facility].   PROCEDURE:  Right heart catheterization was formed percutaneously via  the right femoral vein using a venous sheath and Swan-Ganz  thermodilution catheter.  Left heart catheterization was performed  percutaneously via the right femoral artery using an arterial sheath and  4-French preformed coronary catheters.  A front wall arterial puncture  was performed and Omnipaque contrast was used.  A distal aortogram was  performed to rule out abdominal aortic aneurysm.  The patient tolerated  the procedure well and left the laboratory in satisfactory condition.   RESULTS:  Left main coronary artery was free of significant disease.   Left anterior descending artery gave rise to three diagonal branches and  a septal perforator.  The LAD was diffusely irregular and small in  caliber distally, and there is a 40% narrowing in the proximal to mid  vessel.   The circumflex artery is a moderately large vessel giving rise to an  atrial branch, a large marginal branch, a small marginal branch and two  posterolateral branches.  The circumflex vessel was irregular but with  no significant obstruction.   The right coronary artery is a moderate-sized vessel that  gave rise to a  conus branch, a right ventricular branch, a posterior descending branch  and two posterolateral branches.  There was a long overlapping stent in  the proximal to mid right coronary with 40% narrowing in the proximal  portion.  There was a stent in the distal right coronary artery  extending into the posterior descending branch and__________  posterolateral branch which had a 30% mid stenosis.   The left ventriculogram performed in the RAO projection showed global  hypokinesis with an estimated ejection fraction of 35%.   A distal aortogram was performed which showed patent renal arteries and  no significant aortoiliac obstruction.  There was a small to moderate  distal abdominal aortic aneurysm.   CONCLUSION:  Nonobstructive coronary artery disease status post prior  coronary interventions with 40% narrowing in the proximal LAD,  irregularities in the circumflex artery, 40% narrowing within the stent  in the proximal right coronary artery and 30% narrowing within the stent  in the distal right coronary artery with global hypokinesis and an  estimated fraction of 35%.   RECOMMENDATIONS:  The patient has only nonobstructive coronary artery  disease and  there is no source of ischemia.  Will plan reassurance and  continued secondary risk factor modification.  Will get an ultrasound to  size his abdominal aortic aneurysm.   ADDENDUM:  Hemodynamic data:  The right atrial pressure was 9 mean.  The  pulmonary artery pressure was 31/14 with a mean of 22.  The pulmonary  wedge pressure was 10 mean.  Left ventricular pressure was 113/14.  Aortic pressure was 113/64 with a mean of 85.  Cardiac output/cardiac  index was 4.9/2.4 liters per m2 by Fick.   INDICATIONS:  The      [name] R. Juanda Chance, MD, [facility]  Electronically Signed     BRB/MEDQ  D:  10/09/2006  T:  10/09/2006  Job:  [id number]   cc:   [name] Canning. Ladona Ridgel, MD  Cardiopulmonary Lab

## 2010-06-23 NOTE — Discharge Summary (Signed)
[place]. [facility]  Patient:    [name], [name]                     MRN: [id number] Adm. Date:  [id number] Disc. Date: [id number] Attending:  Pricilla Riffle Dictator:   Joellyn Rued, P.A.C. CC:         Dr. Dorann Lodge, [place], [place]             Dr. Elberta Spaniel, [place]                           Discharge Summary  DATE OF BIRTH: 1934-09-07  HISTORY OF PRESENT ILLNESS: [name] is a 76 year old white male with a history of hypertension, prostate cancer with radiation C, trifascicular block, and ischemic cardiomyopathy of 25-30%.  He has had a history of two MIs in 1992 with angioplasty and on Jul 05, 1999 he underwent stenting of two RCA lesions for non-Q wave MI.  He was transferred from [facility] for further evaluation.  LABORATORY DATA: Laboratories from [facility] showed a sodium of 137, potassium 4.7, BUN 17, creatinine 1.2, glucose 90.  Initial CK was 117. Troponin 0.0.  PT 11.7, PTT 20.8.  Hemoglobin 15.0, hematocrit 46.2; normal indices; platelets 201,000; WBC 7.0.  On transfer subsequent CKs and MBs were negative for myocardial infarction.  Chemistries and hematology remained unremarkable.  EKG showed sinus bradycardia, right bundle branch block, left axis deviation, and left anterior fascicular block.  HOSPITAL COURSE: [name] was admitted to 3700 and overnight he did not have any further chest discomfort.  Catheterization on August 22, 1999 by Dr. Antoine Poche, according to the progress notes, showed a normal left main, normal LAD and circumflex.  It was noted he did have some irregularities in both of these arteries.  In the RCA the proximal stent was widely patent.  The distal stent had a 30% restenosis.  The PDA had a 99% lesion.  Ejection fraction was 25% with anterolateral hypokinesis and apical akinesis.  Dr. Juanda Chance attempted angioplasty on the posterolateral branch lesion and the 80% lesion after attempts at  dilatation appeared to be the same.  There was possible thrombus within the lesion and thus he was maintained on Integrilin for 48 hours. Post-sheath removal and bed rest he was ambulating without difficulty.  By August 24, 1999 the catheterization site was intact and it was felt he could be discharged home.  DISCHARGE DIAGNOSES:  1. Unstable angina, probable cause secondary to thrombus within that lesion.     History as previously described.  2. Ischemic cardiomyopathy.  3. Status post catheterization and attempt to open posterolateral branch.  DISPOSITION: He is discharged home.  DISCHARGE MEDICATIONS:  1. Plavix 75 mg q.d. until Dr. Jerral Ralph decides he does not need this.  2. Ecotrin 325 mg q.d.  3. Altace 2.5 mg b.i.d.  4. Sublingual nitroglycerin as-needed.  5. Coreg 1/2 tablet b.i.d.  DISCHARGE ACTIVITY: He was advised on no lifting, driving, sexual activity, or heavy exertion for two days.  DISCHARGE DIET: Maintain low-salt/low-fat/low-cholesterol diet.  DISCHARGE INSTRUCTIONS: If he has any problems with his catheterization site he is instructed to call immediately.  FOLLOW-UP: He will call Dr. Jerral Ralph for a follow-up appointment. DD:  08/24/99 TD:  08/24/99 Job: [id number]

## 2010-06-23 NOTE — Cardiovascular Report (Signed)
[place]. [facility]  Patient:    [name], [name]                     MRN: [id number] Proc. Date: 07/06/99 Adm. Date:  [id number] Disc. Date: [id number] Attending:  Learta Codding CC:         Heron Nay, M.D., [facility]             Nathen May, M.D., [facility]             Bruce R. Juanda Chance, M.D. [facility]             Cardiopulmonary Laboratory                        Cardiac Catheterization  PROCEDURES:  Cardiac catheterization.  CLINICAL HISTORY:  [name] is 76 years old and has a history of myocardial infarction in 1992 with PTCA of the LAD complicated by reocclusion and subsequent recanalization.  He has been known to have left ventricular dysfunction as a result of this.  He was recently admitted to [facility] with epigastric pain and his troponins were positive and he was felt to have had an non-Q-wave infarction.  DESCRIPTION OF PROCEDURE:  The procedure was performed via the right femoral artery using an arterial sheath and 6 French preformed coronary catheters.  A front wall arterial puncture was performed and Omnipaque contrast was used. After completion of the diagnostic study, a distal aortogram was performed to rule out abdominal aortic aneurysm.  RESULTS:  The left main coronary artery:  The left main coronary artery was free of significant disease.  Left anterior descending:  The left anterior descending artery gave rise to three diagonal branches and a septal perforator.  The LAD had irregularities but no major obstruction, although the flow was reduced and was TIMI-2 flow.  Circumflex artery:  The circumflex artery gave rise to an atrial branch, three marginal branches and a posterolateral branch.  There was 30% narrowing in the mid circumflex artery.  Right coronary artery:  The right coronary is a moderately large vessel that was fairly tortuous with some proximal calcification.  There was a conus branch, a right  ventricular branch, a posterior descending branch and two posterolateral branches.  There was 50% narrowing near the ostium.  There was 80% narrowing in the proximal portion of the vessel.  There was 60-70% narrowing in the midportion of the vessel that had a fairly sharp bend and there was 80% narrowing just before the bifurcation of the posterior descending and posterolateral branches.  LEFT VENTRICULOGRAPHY:  The left ventriculogram performed in the RAO projection showed akinesis of the apex and hypokinesis of the anterolateral wall.  The overall wall motion was reduced with an estimated ejection fraction of 25-30%.  DISTAL AORTOGRAM:  A distal aortogram was performed which showed patent renal arteries and no significant aortoiliac obstruction.  CONCLUSIONS:  Coronary artery disease, status post remote anterior wall infarction with subsequent PCI of the proximal LAD complicated by reocclusion with subsequent recanalization with slow flow in the LAD without significant obstruction, 30% narrowing in the circumflex artery, 50% proximal, 80% proximal and 60-70% mid and 80% distal stenosis in the right coronary artery with anterolateral wall and apical wall akinesis.  RECOMMENDATIONS:  I think that the patient has a recent non-Q-wave infarction, probably due to the lesions in the right coronary artery.  I reviewed these films with Dr. Riley Kill and we  will plan intervention on the proximal and distal right coronary artery.  We had an emergency so we had to stage this and we will plan to do this later in the day. DD:  07/06/99 TD:  07/10/99 Job: [id number]

## 2010-06-23 NOTE — Cardiovascular Report (Signed)
[place]. [facility]  Patient:    KEANU, LESNIAK                     MRN: [id number] Proc. Date: 01/23/00 Adm. Date:  [id number] Attending:  Lenoria Farrier CC:         CV Laboratory  Belva Crome, M.D.  Bruce Elvera Lennox Juanda Chance, M.D. [facility]   Cardiac Catheterization  INDICATIONS:  Mr. Low is a pleasant 76 year old who has previously undergone percutaneous coronary intervention.  He has had a prior anterior wall infarction with known wall motion abnormality.  On the last procedure, he had attempted PTCA of a posterolateral branch through a stent leading into the posterior descending artery. Because of this, he has developed recurrent angina suggestive of unstable angina.  He was transferred for further evaluation.  PROCEDURE: 1. Left heart catheterization. 2. Selective coronary arteriography. 3. Selective left ventriculography.  CARDIOLOGIST:  Arturo Morton. Riley Kill, M.D. [facility]  DESCRIPTION OF THE PROCEDURE:  The procedure was performed from the right femora artery using 6-French catheter.  He tolerated the procedure well without complication.  I reviewed the films and the previous films in detail. With the lack of a critical stenosis, it was decided to complete the procedure and taken the patient off the table.  He was taken to the holding area in satisfactory clinical condition.  HEMODYNAMIC DATA: The central aortic pressure was 134/30.  LV pressure 134/72.No gradient on pullback across the aortic valve.  ANGIOGRAPHIC DATA:  On plain fluoroscopy, there was mild calcification of the LAD.  The left main coronary artery was free of critical disease.  The left anterior descending artery courses to the apex where it bifurcated. There were two diagonal branches and a major septal perforator.  Just beyond the septal perforator is an area of narrowing of about 30 to 40% that appears slightly eccentric.  In multiple views, however, this does not appear to  be a flow-limiting lesion.  The diagonal branches have perhaps mild irregularities at the ostium, but both are fairly small, and no critical stenoses are noted.  The circumflex provides a tiny first marginal branch, a large second marginal branch, tiny third and fourth marginal branches, and then forms two fairly large posterolateral branches.  There is perhaps 20% narrowing just beyond the bifurcation of the two large posterolateral branches, but there does not appear to be a high-grade stenosis.  The remainder of the vessel is without critical disease.  The right coronary artery demonstrates a stent on plain fluoroscopy in both the proximal vessel just beyond a right ventricular branch and at the crux. The proximal right coronary artery at the stent site demonstrates only about 20 to 30% narrowing without evidence of significant restenosis.  At the crux, there is about 40% narrowing, but this does not appear to be high grade in any view leading into the posterior descending branch.  On the previous angiographic study, there was about 80% narrowing at the completion of the procedure in the posterolateral branch.  The overall caliber of the vessel may be small, but there does not appear to be a substantial change in caliber of the lesion.  This would be graded at about 50 to no more than 70% in residual luminal narrowing, although it potentially could be more prominent with vasodilation.  However, it does not appear to be changed, and the ostium does not appear to be critically compromised.  Ventriculography in the RAO projection reveals preserved global systolic function  with anterolateral hypokinesis.  Ejection fraction is calculated at 45.9%.  CONCLUSIONS: 1. Moderate anterolateral hypokinesis with mild reduction in global left    ventricular function. 2. No evidence of restenosis at the previously placed stent sites. 3. Moderate stenosis of the posterolateral branch without  worsening from the    previous study.  DISPOSITION: 1. Continued medical therapy. 2. Persantine Cardiolite study. DD:  01/23/00 TD:  01/23/00 Job: [id number]

## 2010-06-23 NOTE — Op Note (Signed)
NAME:  [name], [name]                        ACCOUNT NO.:  [id number]   MEDICAL RECORD NO.:  [id number]                   PATIENT TYPE:  OIB   LOCATION:  3733                                 FACILITY:  [facility]   PHYSICIAN:  Doylene Canning. Ladona Ridgel, M.D.               DATE OF BIRTH:  [birth year]   DATE OF PROCEDURE:  10/05/2003  DATE OF DISCHARGE:                                 OPERATIVE REPORT   PROCEDURE PERFORMED:  Implantable cardioverter-defibrillator lead removal  and insertion of a new implantable cardioverter-defibrillator lead.   INDICATIONS:  ICD lead dislodgement with subsequent demonstration of ICD  lead dysfunction.   INTRODUCTION:  The patient is a 76 year old male with an ischemic  cardiomyopathy, status post myocardial infarction approximately four years  ago.  His ejection fraction was 30% and he has right bundle branch block  with a QRS duration of 136 msec.  He has class II heart failure.  He was  initially referred for ICD insertion back on August 24.  At that time he had  a St. Jude Riata defibrillator lead placed with a Environmental manager.  He  was noted to have some sensation of irregular heart beats and came to the  office approximately five days after his surgery and at that time found to  have evidence of lead dislodgement.  He is now referred for ICD lead  revision.   PROCEDURE:  After informed consent was obtained, the patient was taken to  the diagnostic EP lab in the fasting state.  After the usual preparation and  draping, intravenous fentanyl and midazolam was given for sedation.  Lidocaine 30 mL was infiltrated over the left infraclavicular site and a 9  cm incision was carried out over this region.  Electrocautery was utilized  to dissect down to the fascial plane.  The defibrillator system was removed  after it had been turned off.  Initially the ICD generator (Epic single-  chamber from [facility]. Jude) was removed and placed in a kanamycin bath.  At this  point the defibrillator lead was removed after the coils were retracted into  the lead body.  Mapping was then carried out and at the final site near the  RV apex, the R-waves were initially 9 mV.  The pacing threshold was a volt  at 0.5 msec, and the pacing impedance was stable.  The lead was resecured to  the subpectoralis fascia with a figure-of-eight silk suture and the sewing  sleeve was also secured with silk suture.  The device was connected back to  the defibrillator lead and back in the subcutaneous pocket.  Irrigation was  utilized with kanamycin an defibrillation threshold testing carried out.   After the patient was more deeply sedated with fentanyl and Versed, VF was  induced with a T-wave shock.  This resulted in a 12-joule shock, which  terminated ventricular fibrillation and restored sinus rhythm.  Interrogation of  the patient's lead after this was carried out, however,  demonstrated a marked change in the patient's lead impedance, pacing  threshold, and sensing despite no movement in the lead body itself.  At this  point the lead screw was retracted back into the body and mapping again  carried out at sites along the RV apical septum.  At the new location the R-  waves measured 9-11 mV and the pacing threshold was again satisfactory.  Defibrillation threshold testing was then carried out again and again VF was  induced with a T-wave shock and a 12-joule shock terminating ventricular  fibrillation.  Again, however, assessment of the patient's post-ICD shock  pacing and sensing as well as pacing impedance demonstrated marked  deterioration in the capture and sensing parameters, and for this reason it  was deemed that the lead itself must have significant problems.  At this  point the old Riata defibrillation lead was removed with gentle traction and  the left subclavian vein was punctured and an introducer sheath advanced  into the vein over the guidewire.  A new Riata model  1588 active-fixation  defibrillation lead, serial number [id number], was then placed in the  subclavian vein and advanced into the right ventricle.  At this time mapping  was carried out and the RV apical waves were markedly diminished throughout  the myocardium.  For this reason the lead was placed in the RV septum  approximately halfway from the outflow tract to the RV apex.  At this  location the R-waves measured 11 mV and the pacing impedance once the lead  was actively fixed was 894 Ohms with a pacing threshold of 0.7 V at 0.5  msec.  Again 10-volt pacing at this location did not stimulate the  diaphragm.  With this new location and new lead, the leads were secured to  the subpectoralis fascia with a figure-of-eight silk suture and the sewing  sleeve was also secured with silk suture.  The previously-implanted St. Jude  Epic Plus defibrillator was then connected to the new Riata defibrillation  lead and placed in the subcutaneous pocket.  Defibrillation threshold  testing was then carried out.   After the patient was more deeply sedated with fentanyl and Versed, VF was  induced with a T-wave shock and an initial 12-joule shock was delivered,  which terminated ventricular fibrillation and restored sinus rhythm.  Five  minutes was allowed to elapse and a second and final DFT test carried out.  Again ?VF was induced with a T-wave shock and again the 12.5 joule shock was  delivered, which terminated ventricular fibrillation and restored sinus  rhythm.  The pacing impedance at the final site was 0.5 V at 0.5 msec with a  pacing impedance of 840 Ohms.  The R-waves measured 9 mV.  At this point  additional kanamycin was utilized to irrigate the incision and the incision  then closed with a layer of 2-0 Vicryl, followed by a layer of 3-0 Vicryl,  followed by a layer of 4-0 Vicryl.  Benzoin was painted on the skin and Steri-Strips were applied and a pressure dressing placed, and the patient   returned to his room in satisfactory condition.   COMPLICATIONS:  There were no immediate procedural complications.   RESULTS:  This demonstrates successful removal of a previously-implanted St.  Jude Riata defibrillation lead secondary to lead malfunction and insertion  of a new defibrillation lead with satisfactory defibrillation threshold  parameters.  Doylene Canning. Ladona Ridgel, M.D.    GWT/MEDQ  D:  10/05/2003  T:  10/06/2003  Job:  [id number]   cc:   Aundra Dubin. Revankar, M.D.  [address]  [place]  [place] [address]  Fax: [phone]   Elana Alm. Nicholos Johns, M.D.  [address]  [place]  [place] [address]  Fax: [phone]

## 2010-06-23 NOTE — Discharge Summary (Signed)
NAME:  [name], [name]                        ACCOUNT NO.:  [id number]   MEDICAL RECORD NO.:  [id number]                   PATIENT TYPE:  INP   LOCATION:  3742                                 FACILITY:  [facility]   PHYSICIAN:  Doylene Canning. Ladona Ridgel, M.D.               DATE OF BIRTH:  07/24/1934   DATE OF ADMISSION:  09/29/2003  DATE OF DISCHARGE:                                 DISCHARGE SUMMARY   DISCHARGE DIAGNOSES:  1. Ischemic cardiomyopathy.  2. Class II congestive heart failure.  3. Status post myocardial infarction.  4. Ejection fraction 30%.  5. Right bundle-branch block.   SECONDARY DIAGNOSIS:  History of anterior wall myocardial infarction status  post percutaneous transluminal coronary angioplasty/stenting.   PROCEDURE:  September 29, 2003:  Placement of St. Jude single-chamber ICD with  acceptable defibrillator testing less than 10 joules.  It is a [employer] building [employer] Epic+ VR cardioverter defibrillator, serial number [id number].   DISCHARGE DISPOSITION:  Mr. Breydon Senters is discharged his post procedure  day #1 after implantation of ICD by Dr. Lewayne Bunting.  X-ray shows  satisfactory lead placement with all leads intact.  The pacer incision site  is healing nicely without ecchymosis or drainage or erythema.  The patient  is relatively pain free.  His pain will be controlled with Tylenol 325 mg  one to two tablets q.4-6h.   He goes home on the following medications:  1. Lisinopril 40 mg daily.  2. Carvedilol 12.5 mg one-half tablet twice daily.  3. Niacin 1000 mg daily at bedtime.  4. Aspirin 81 mg daily.  5. Zocor 20 mg one-half tablet at bedtime.   Activity sheet has been gone over with the patient.  Essentially, the  patient is to keep his left arm quiet by his side for the next 4 days.  He  may then successively raise it as described.  He is not to drive until 1  week - that would be Wednesday, October 06, 2003.  He is to keep the incision  dry and he may shower  beginning Wednesday, August 31.  Appointments have  been made for him to see the pacer clinic at [facility] in 2 weeks  and also Dr. Ladona Ridgel in 3 months.   BRIEF HISTORY:  [name] is a 76 year old male who is referred to Dr.  Ladona Ridgel for evaluation and consideration of implantable cardioverter  defibrillator according to the MADIT II study.  The patient has a history of  prior myocardial infarction, ejection fraction of 30%.  He has a history of  stenting in the past.  He has never had syncope and he has never had any  history of palpitations or sustained ventricular tachycardia.  The risks and  benefits and goals for this procedure have been discussed with the patient  in detail and he will be scheduled for elective implantation of cardioverter  defibrillator scheduled at  [facility] for August 24.   HOSPITAL COURSE:  As described in discharge disposition.  The patient was  ready for discharge post procedure day #1.      Maple Mirza, P.A.                    Doylene Canning. Ladona Ridgel, M.D.    GM/MEDQ  D:  09/29/2003  T:  09/30/2003  Job:  [id number]   cc:   Aundra Dubin. Revankar, M.D.  [address]  [place]  [place] [address]  Fax: [phone]

## 2010-06-23 NOTE — Discharge Summary (Signed)
NAME:  [name], [name]                        ACCOUNT NO.:  [id number]   MEDICAL RECORD NO.:  [id number]                   PATIENT TYPE:  OIB   LOCATION:  3733                                 FACILITY:  [facility]   PHYSICIAN:  Doylene Canning. Ladona Ridgel, M.D.               DATE OF BIRTH:  04-14-1934   DATE OF ADMISSION:  10/05/2003  DATE OF DISCHARGE:  10/06/2003                                 DISCHARGE SUMMARY   DISCHARGE DIAGNOSES:  1.  Dislodgement of lead from single-chamber St. Jude implantable      cardioverter-defibrillator implanted September 29, 2003.  2.  Admitted electively, October 06, 2003, for implantable cardioverter-      defibrillator lead revision.  3.  Status post St. Jude single-chamber implantable cardioverter-      defibrillator model V-196 Epic Plus VR on September 29, 2003.   SECONDARY DIAGNOSES:  1.  Class II congestive heart failure.  2.  Status post myocardial infarction, ejection fraction 30%.  3.  Status post angioplasty and stenting in the past.  4.  Right bundle branch block.   PROCEDURE:  Elective ICD lead revision with defibrillator testing less than  12 joules.  The lead required removal and insertion of a new lead.   The patient will go home on five days of antibiotics.   DISCHARGE MEDICATIONS:  1.  Lisinopril 40 mg daily.  2.  Carvedilol 12.5 mg 1/2 tab twice daily.  3.  Niacin 1,000 mg daily at bedtime.  4.  Aspirin 81 mg daily.  5.  Zocor 20 mg one half tablet at bedtime.  And now at discharge,  1.  Clindamycin 300 mg three times daily.  2.  Darvocet-N 100 1-2 tabs every 3-4 hours as needed for pain.   ACTIVITY:  Has been discussed with the patient.   DISCHARGE DIET:  Low-sodium, low-cholesterol.   The patient is instructed to keep the incision dry until Tuesday, October 12, 2003.  At that time, he can resume showering.   1.  He has an office visit to the [facility] at [address], Monday, October 25, 2003,  at 9 in the morning.  2.  He will see Dr. Ladona Ridgel on January 26, 2004, at 11:50 in the morning.   BRIEF HISTORY:  [name] is a 76 year old male.  He has ischemic  cardiomyopathy with class II congestive heart failure symptoms.  He is also  status post myocardial infarction with ejection fraction of 30%.  He  presented on September 29, 2003, at which time he had a St. Jude single-chamber  ICD with acceptable defibrillator testing implanted.  About 48 hours later,  he noted that he was having slow but pronounced heart beats and called the  office of [facility].  His ICD was interrogated and found to be  faulty, apparently one lead had dislodged.  The patient  was admitted, on  October 06, 2003, for elective lead replacement.   HOSPITAL COURSE:  The patient presented for lead replacement on October 05, 2003.  This was done successfully by Dr. Lewayne Bunting.  The patient had no  post procedure complications, except that just prior to discharge he felt  hot and had some diaphoresis but no chest pain or shortness of breath.  He  was in a sinus rhythm throughout the post procedure period, and he was not  febrile.  He was given, however, Tylenol 352 mg two tablets, also Benadryl  25 mg and since he had received Keflex that was his only new medication one  hour prior to that, his discharge antibiotic was changed from Keflex 500 mg  twice daily to clindamycin 300 mg three times daily.  The patient goes home  with the medications and with this new medication clindamycin as dictated.      Maple Mirza, P.A.                    Doylene Canning. Ladona Ridgel, M.D.    GM/MEDQ  D:  10/06/2003  T:  10/06/2003  Job:  [id number]   cc:   Doylene Canning. Ladona Ridgel, M.D.   Aundra Dubin. Revankar, M.D.  [address]  [place]  [place] [address]  Fax: [phone]

## 2010-06-23 NOTE — Op Note (Signed)
NAME:  [name], [name]                        ACCOUNT NO.:  [id number]   MEDICAL RECORD NO.:  [id number]                   PATIENT TYPE:  INP   LOCATION:  2852                                 FACILITY:  [facility]   PHYSICIAN:  Doylene Canning. Ladona Ridgel, M.D.               DATE OF BIRTH:  Apr 13, 1934   DATE OF PROCEDURE:  09/29/2003  DATE OF DISCHARGE:                                 OPERATIVE REPORT   PROCEDURE:  Implantation of a single chamber defibrillator.   INDICATIONS FOR PROCEDURE:  Ischemic cardiomyopathy with class I congestive  heart failure status post myocardial infarction with an ejection fraction of  30%.   PERFORMING PHYSICIAN:  Doylene Canning. Ladona Ridgel, M.D.   I. HISTORY OF PRESENT ILLNESS:  The patient is a 76 year old man status post  myocardial infarction in 2001. His ejection fraction is 30%.  He has had  class II heart failure for the last year.  He has never had syncope.  He had  a right bundle branch block QRS with a QRS duration of 136 milliseconds and  is now referred for biventricular ICD insertion.   II. PROCEDURE:  After informed consent was obtained the patient was taken to  the diagnostic electrophysiologic laboratory in the fasting state. After the  usual preparation and drape, intravenous Fentanyl and midazolam were given  for sedation.  Thirty mL of lidocaine was infiltrated into ithe left  infraclavicular region.  A 9 cm incision was carried out over this region  and electrocautery utilized to dissect down to the fascial plane.  Ten mL of  contrast was then injected into the left upper extremity venous system  demonstrating a patent left subclavian vein. It was subsequently punctured  and the St. Jude Riata, model 1581, 65 cm active fixation defibrillation  lead, serial #[id number] was advanced into the right ventricle.  Mapping was  carried out in the right ventricle and at the final site the R waves  measured 16 millivolts and the pacing impedance was 1100 ohms once the  lead  was actively fixed.  Ten volt pacing from the site did not stimulate the  diaphragm.  The pacing threshold at 0.5 volts was 0.5 milliseconds.  With  the lead in satisfactory position, it was secured to the subpectoralis  fascia with a figure-of-eight silk suture.  In addition, the sewing sleeve  was secured with silk suture.  Electrocautery was used to make a  subcutaneous pocket.  Kanamycin irrigation was utilized the pocket and  electrocautery utilized to assure hemostasis.  At this point, the [facility]. Jude  Epic Plus VR model BU-196 single chamber defibrillator, serial [id number] was  connected to the defibrillation lead and placed in the subcutaneous pocket.  The generator was secured with silk suture.  Additional Kanamycin irrigation  was utilized to irrigate the pocket and the patient sedated more deeply for  defibrillation threshold testing.   After the patient was more deeply  sedated with Fentanyl and Versed, VF was  induced with a T wave shock. A 12 joule shock was then delivered terminating  ventricular fibrillation and restoring sinus rhythm.  Five minutes was  allowed to elapse and a second VF test carried out.  Again, VF was induced  with a T wave shock and this time a 10 joule shock was delivered which again  terminated ventricular fibrillation and restored sinus rhythm.  At this  point with acceptable safety margins for defibrillation testing, the  incision was closed with a layer of 2-0 Vicryl followed by a layer of 3-0  Vicryl followed by a layer of 4-0 Vicryl.  Benzoin was painted on the skin  and Steri-Strips were applied and a pressure dressing placed.  The patient  was returned to his room in satisfactory condition.   III. COMPLICATIONS:  There were no immediate procedure complications.   IV. RESULTS:  This demonstrates successful implantation of a St. Jude single  chamber defibrillator in a patient with an ischemic cardiomyopathy, class II  heart failure, right  bundle branch block and an ejection fraction of 30%.                                               Doylene Canning. Ladona Ridgel, M.D.    GWT/MEDQ  D:  09/29/2003  T:  09/29/2003  Job:  [id number]   cc:   Aundra Dubin. Revankar, M.D.  [address]  [place]  [place] [address]  Fax: [phone]   Elana Alm. Nicholos Johns, M.D.  [address]  [place]  [place] [address]  Fax: [phone]

## 2010-06-23 NOTE — Discharge Summary (Signed)
[place]  Patient:    [name], [name]                     MRN: [id number] Adm. Date:  [id number] Disc. Date: [id number] Attending:  Lenoria Farrier Dictator:   Abelino Derrick, P.A.C. [facility] CC:         Dr. Everardo Beals. [place]  Dr. Tomie China, [address], [place], [place]   Discharge Summary  DISCHARGE DIAGNOSES:  1. Known coronary disease, with previous right coronary artery stenting;     catheterization this admission revealing no restenosis.  2. Prior anterior myocardial infarction, with left ventricular dysfunction.  HOSPITAL COURSE: The patient is a 76 year old male, followed by Dr. Juanda Chance and Dr. Tomie China, who has had a prior anterior wall infarct with known wall motion abnormality.  He has had a previous posterolateral branch angioplasty.  He has had previous RCA stenting.  He was admitted on January 22, 2000 with chest pain and set up for cardiac catheterization.  The patient was admitted to telemetry and started on Lovenox and nitrates.  Cardiac catheterization was done on January 23, 2000 by Dr. Riley Kill, which revealed an EF of 45% by catheterization, 30-40% proximal LAD narrowing, normal circumflex, patent stent site of the RCA, and a 50-70% distal RCA, with 40% PLA from the prior procedure.  Lovenox was discontinued and he was set up for a Persantine Cardiolite study, which was done on January 24, 2000.  This revealed extensive scar anteriorly and apically, with no ischemia, and with an EF of 27%.  The plan is for continued medical therapy.  DISCHARGE MEDICATIONS:  1. Altace 2.5 mg b.i.d.  2. Enteric-coated aspirin q.d.  3. Coreg 6.25 mg in the morning and 3.125 mg at night.  4. Nitroglycerin sublingual p.r.n.  LABORATORY DATA: CK-MBs and troponins were negative.  Sodium 135, potassium 3.9, BUN 15, creatinine 1.3.  WBC 7.8, hemoglobin 13.7, hematocrit 39.6; platelets 167,000.  EKG shows sinus rhythm with right bundle branch  block, left anterior fascicular block.  DISCHARGE CONDITION: Stable.  FOLLOW-UP: The patient is to follow up with Dr. Tomie China. DD:  01/24/00 TD:  01/25/00 Job: [id number]

## 2010-06-23 NOTE — Cardiovascular Report (Signed)
[place]  Patient:    [name], [name]                     MRN: [id number] Proc. Date: 08/22/99 Adm. Date:  [id number] Attending:  Pricilla Riffle CC:         De Blanch, M.D.                        Cardiac Catheterization  DATE OF BIRTH:                [birth year].  PRIMARY CARDIOLOGIST:         Dr. Elby Showers Revankar.  PROCEDURE:                    Left heart catheterization/coronary arteriography.  INDICATION:                   Evaluate patient with chest pain and previous PTCA and stenting of the RCA with a known ischemic cardiomyopathy.  DESCRIPTION OF PROCEDURE:     Left heart catheterization was performed via the right femoral artery.  The artery was cannulated using an anterior wall puncture.  A #6-French arterial sheath was inserted via the modified Seldinger technique.  Preformed Judkins and a pigtail catheter were utilized.  The patient tolerated the procedure well.  RESULTS:  HEMODYNAMICS:                 LV 116/20, AO 122/70.  CORONARIES:                   The left main was normal.  The LAD had proximal 25% stenosis and diffuse luminal irregularities.  The circumflex had diffuse 25% lesions in the AV groove. The right coronary artery was a large dominant vessel.  There were diffuse luminal irregularities.  A proximal stent was widely patent.  A distal stent which bridged into the PDA was patent with 30% end-stent restenosis.  The continuation of the RCA after the PDA had a 99% stenosis right at the point of the stent.  LEFT VENTRICULOGRAM:          Left ventriculogram was obtained in the RAO projection.  The EF was 25% with anterolateral hypokinesis and apical akinesis.  CONCLUSION:                   Severe left ventricular dysfunction.  High-grade residual right coronary artery stenosis.  PLAN:                         The patient will have percutaneous revascularization of the residual RCA stenosis per Dr. Juanda Chance. DD:   08/22/99 TD:  08/23/99 Job: [id number]

## 2010-06-23 NOTE — Cardiovascular Report (Signed)
[place]. [facility]  Patient:    [name], [name]                     MRN: [id number] Proc. Date: 08/22/99 Adm. Date:  [id number] Attending:  Pricilla Riffle CC:         Dorann Lodge, M.D. [place]             Belva Crome, [place]             Rollene Rotunda, M.D. [facility]             Bruce R. Juanda Chance, M.D. [facility]                        Cardiac Catheterization  CARDIOLOGIST:  Everardo Beals. Juanda Chance, M.D. [facility]  INDICATIONS:  [name] is 76 years old and has had a remote anterior wall myocardial infarction and non-Q-wave infarction in May of this year and was found to have tight lesions in the right coronary artery and underwent stenting of the proximal and distal right coronary artery.  The distal stent crossed a posterolateral branch.  There was some compromise of the posterolateral branch, but this was not further treated.  The patient did well for about a month and developed recurrent symptoms characteristic of angina. He was admitted for further evaluation.  PROCEDURE:  The diagnostic procedure was performed immediately prior to this by Dr. Antoine Poche.  This showed an 80% lesion in the posterolateral branch at the ostial takeoff from within the second stent in the distal right coronary artery.  This was an AVE stent.  There was moderate disease elsewhere but no other critical disease, and the left ventricular ejection fraction was depressed at about 25%.  The patient was given weight-adjusted heparin, ______ seconds and was given double bolus Integrilin infusion.  We used a hockey stick 7-French guiding catheter with side holes and two Patriot wires.  We crossed the lesion into the posterolateral branch with a long Patriot wire without too much difficulty.  We passed a 2.5 over-the-wire Maverick balloon down near the lesion.  We then passed a second Patriot wire into the posterior descending branch through the stent.  We initially dilated with a 2.5 balloon in  the posterolateral branch, performing one inflation of 10 atmospheres for 59 seconds.  We then exchanged the balloon and put the balloon in the main channel and performed two inflations of 8 atmospheres for 48 and 52 seconds. The first balloon inflation in the posterolateral branch resulted in some mild compromise of the takeoff of the posterior descending branch.  The second balloon dilatations in the posterior descending branch resulted in more severe compromise of the posterolateral branch.  For this reason, we decided to do a double balloon inflation, and we passed a 2-0 monorail Maverick into the posterolateral branch and the same 2.5, 15 mm Maverick into the posterior descending branch within the stent.  We had difficulty inflating these balloons because inflation of the balloon pushed one or the other balloon outside the main channel.  We were finally able to position the balloons in such a way that we could perform simultaneous inflations in both balloons at 7 atmospheres.  We removed the balloons, and repeat diagnostic study still showed compromise of the ostium of the posterolateral branch.  In order to ensure that there was no compromise of the main branch, we went back in with a 2.5 Maverick and performed another inflation within  the stent up to 10 atmospheres.  Repeat diagnostic study was then performed with the guiding catheter.  The patient tolerated the procedure well and left the laboratory in satisfactory condition.  RESULTS:  Initially. the stenosis in the posterolateral branch was estimated at 80%.  Following dilatation, it still appeared 80%, although it is possible all of this had somewhat the appearance of a filling defect and may have been related to thrombus.  CONCLUSION:  Unsuccessful attempt at simultaneous balloon angioplasty of the branch stenosis into the posterolateral branch of the right coronary artery due to recoil or thrombus with initial and final  stenoses being approximately 80%.  DISPOSITION:  The patient is returned to post angioplasty unit.  We will plan to leave the patient on Integrilin for 48 hours with the hopes that some of this is thrombus which will improve.  We will also treat him with Plavix. Hopefully, if he does have recurrent angina, this can be managed medically rather than surgically since he does not have major disease in the other vessels. DD:  08/22/99 TD:  08/23/99 Job: [id number]

## 2010-06-23 NOTE — Cardiovascular Report (Signed)
. [facility]  Patient:    [name], [name]                     MRN: [id number] Proc. Date: 07/06/99 Adm. Date:  [id number] Disc. Date: [id number] Attending:  Learta Codding CC:         Heron Nay, M.D., [facility]             Nathen May, M.D., [facility]             Bruce R. Juanda Chance, M.D. [facility]             Cardiopulmonary Laboratory                        Cardiac Catheterization  INDICATIONS:  Mr. Eisler is 76 years old and has remote anterior wall infarction and a recent non-Q-wave infarction and was studied earlier today and found to have tight lesions in the right coronary artery, which we planned to intervene on this afternoon.  DESCRIPTION OF PROCEDURE:  The procedure was performed via the right femoral artery using an arterial sheath and initially a JR4 7 [place] guiding catheter. The patient was given weight-adjusted heparin to prolong the ACT to greater than 200 seconds and was given a double bolus Integrilin and infusion.  We crossed the lesion in the distal right coronary artery with a short luge wire without difficulty.  We initially dilated with a 2.75 x 15 mm Quantum Ranger with one inflation of 10 atmospheres for 59 seconds.  We felt this was somewhat undersized and we went up to a 3.25 x 15 mm Quantum Ranger to perform two inflations of 8 atmospheres for 48 and 52 seconds.  This resulted in a dissection.  We attempted to obtain a radius stent down the right coronary artery but we were unable to advance it due to tortuosity and calcification. We exchanged for an Extra Support wire and were still were unable to pass the radius stent.  We then switched guiding catheter to a hockey-stick guiding catheter with side holes and an Extra Support wire with these two were able to pass an AVE stent with difficulty down to position it in the distal right coronary artery across the posterolateral branch at the lesion.  The patient was  ischemic with decreased flow in the posterior descending artery during this part of the procedure.  We deployed this 2.5 x 18 mm S670 stent with one inflation at 14 atmospheres for 41 seconds.  This reestablished flow in the distal vessel.  We then approached the proximal vessel.  There was disease in both the proximal and mid vessel.  The proximal vessel appeared somewhat worse and the mid vessel was located as a sharp band and we elected not to treat that.  We deployed a 3.0 x 18 mm S670 AVE stent with two inflations of 14 and 15 atmospheres for 40 and 30 seconds.  We then postdilated with a 3.25 Quantum Ranger performing one inflation up to 15 atmospheres for 40 seconds.  Repeat diagnostic studies were then performed through the guiding catheter.  The patient tolerated the procedure well and left the laboratory in stable condition.  RESULTS:  Initially the stenosis in the proximal right coronary artery was estimated at 80%.  Following stenting, this improved to less than 10%.  The lesion just before the bifurcation of the posterior descending and posterolateral branch was initially 80% and following stenting  this improved to 0%.  There was slight compromise of the posterolateral branch but the flow was never reduced.  CONCLUSIONS: 1. Successful stenting of the distal right coronary artery with improvement in    percent diameter narrowing from 80% to 0%. 2. Successful stenting of the proximal right coronary artery with improvement    in percent diameter narrowing from 80% to 0%.   DISPOSITION:  The patient was returned to the postangioplasty unit for further observation. DD:  07/06/99 TD:  07/10/99 Job: [id number]

## 2010-06-23 NOTE — Discharge Summary (Signed)
[place]. [facility]  Patient:    ADRIANE, GUGLIELMO                     MRN: [id number] Adm. Date:  [id number] Disc. Date: 07/08/99 Attending:  Learta Codding Dictator:   Lavella Hammock, P.A. CC:         Dorann Lodge, M.D., [place], [place]             Lewayne Bunting, M.D.                           Discharge Summary  DATE OF BIRTH:  08-02-1934  PROCEDURES: 1. Cardiac catheterization. 2. Coronary arteriogram. 3. Left ventriculogram. 4. PTCA x 2 with one stent of one vessel.  HOSPITAL COURSE:  Mr. Dommer is a 76 year old male with a history of MI in 1992 and prostate cancer, who was transferred to [facility] from [facility] for suspected non-Q wave MI.  He was on aspirin, beta blockers, and Lovenox and a cardiac catheterization was planned.  He had a cardiac catheterization on Jul 06, 1999 showing a normal left main and a normal LAD system with a 30% circumflex.  The RCA had a 50% proximal, an 80% mid, and a 70% lesion in the mid portion as well.  He had an 80% distal lesion.  He had percutaneous intervention on the two 80% lesions with the stenosis reduced to 0%.  There was one stent placed.  He had anterolateral hypokinesis and apical akinesis with an ejection fraction of 25-30%.  He tolerated the procedure well and the sheath was removed without difficulty; however, the next day he had a bump in his CK-MBs and this had not trended down by the afternoon.  It was decided to keep him over one more day.  He also had hypokalemia with a potassium level of 3.3 and this was supplemented.  By July 08, 1999, his CK-MB was borderline normal and his potassium on recheck was 3.8.  He had no chest pain and he was ambulating well.  He was considered stable for discharge on July 08, 1999.  LABORATORY DATA:  Sodium 133, potassium 3.8, chloride 94, CO2 31, BUN 17, creatinine 1.4, glucose 109.  Hemoglobin 14.4, hematocrit 41.6, WBC 9.5, platelets 220.  CONDITION  ON DISCHARGE:  Improved.  CONSULTATIONS:  None.  COMPLICATIONS:  None.  DISCHARGE DIAGNOSES: 1. Coronary artery disease, status post percutaneous transluminal coronary    angioplasty x 2 with one stent to the right coronary artery this admission. 2. Status post myocardial infarction in [place] in 1992. 3. Prostate cancer. 4. History of hypertension with some hypertension this admission, resolved. 5. Unknown lipid status, follow up with primary medical doctor. 6. Left ventricular dysfunction with an ejection fraction of 25-30% by    catheterization this admission.   DISCHARGE INSTRUCTIONS:  He is to do no driving for two days, no strenuous or sexual activity for a week.  He is to stick to a low fat diet.  He is to call the office for bleeding, swelling, or drainage at the catheterization site.  FOLLOW-UP:  He is to follow up with Dr. Dorann Lodge as needed in [facility] and he is to call for an appointment.  He is to make an appointment with the cardiology P.A. in [place] if he does not follow up at his home.  DISCHARGE MEDICATIONS: 1. Atenolol 25 mg q.d. 2. Lozol 2.5 mg  q.d. 3. Coated aspirin 325 mg q.d. while on Plavix and then b.i.d. 4. Plavix 75 mg q.d. for a month. 5. Altace 2.5 mg 2 tablets q.a.m. and 1 tablet q.p.m. 6. Nitroglycerin 0.4 mg sublingual p.r.n.  ADDENDUM:  The patient has not had cardiology follow-up as an outpatient prior to this admission, since his last hospitalization.  He is not sure where he will follow up and requests not to follow up with Dr. Sherril Croon.  He is to call his family physician and discuss the matter with him and he has been given the number in [place] and is to call and follow up with [place] if he does not follow up locally. DD:  07/08/99 TD:  07/08/99 Job: [id number]

## 2010-08-29 ENCOUNTER — Ambulatory Visit (INDEPENDENT_AMBULATORY_CARE_PROVIDER_SITE_OTHER): Payer: Medicare Other | Admitting: Internal Medicine

## 2010-08-29 ENCOUNTER — Encounter: Payer: Self-pay | Admitting: Internal Medicine

## 2010-08-29 DIAGNOSIS — I472 Ventricular tachycardia: Secondary | ICD-10-CM

## 2010-08-29 DIAGNOSIS — I509 Heart failure, unspecified: Secondary | ICD-10-CM

## 2010-08-29 DIAGNOSIS — Z95810 Presence of automatic (implantable) cardiac defibrillator: Secondary | ICD-10-CM

## 2010-08-29 NOTE — Assessment & Plan Note (Signed)
He has had no recurrent ventricular arrhythmias. Continue current medical therapy. 

## 2010-08-29 NOTE — Assessment & Plan Note (Signed)
His device is working normally. We'll recheck in several months. 

## 2010-08-29 NOTE — Progress Notes (Signed)
HPI [name] returns today for followup. He is a 76 year old male with a history of an ischemic cardiomyopathy, chronic systolic heart failure, hypertension, status post ICD implantation. He denies chest pain or shortness of breath. He has had no ICD shocks. No Known Allergies   Current Outpatient Prescriptions  Medication Sig Dispense Refill  . aspirin 81 MG tablet Take 81 mg by mouth daily.        . carvedilol (COREG) 3.125 MG tablet Take 3.125 mg by mouth 2 (two) times daily with a meal.        . cetirizine (ZYRTEC) 10 MG tablet Take 10 mg by mouth daily.        Marland Kitchen lisinopril (PRINIVIL,ZESTRIL) 20 MG tablet Take 20 mg by mouth daily.        . niacin (NIASPAN) 1000 MG CR tablet Take 1,000 mg by mouth at bedtime.        . nitroGLYCERIN (NITROSTAT) 0.4 MG SL tablet Place 0.4 mg under the tongue every 5 (five) minutes as needed.        . pravastatin (PRAVACHOL) 20 MG tablet Take 20 mg by mouth daily.           Past Medical History  Diagnosis Date  . HTN (hypertension)   . Cardiomyopathy, ischemic   . Presence of permanent cardiac pacemaker   . CHF (congestive heart failure)   . Myocardial infarction     ROS:   All systems reviewed and negative except as noted in the HPI.   Past Surgical History  Procedure Date  . Angioplasty / stenting femoral   . Cardiac defibrillator placement     ICD-St Jude     No family history on file.   History   Social History  . Marital Status: Married    Spouse Name: N/A    Number of Children: N/A  . Years of Education: N/A   Occupational History  . Not on file.   Social History Main Topics  . Smoking status: Former Games developer  . Smokeless tobacco: Not on file  . Alcohol Use: Not on file  . Drug Use: Not on file  . Sexually Active: Not on file   Other Topics Concern  . Not on file   Social History Narrative  . No narrative on file     BP 119/68  Pulse 54  Resp 14  Wt 186 lb (84.369 kg)  Physical Exam:  Well appearing  NAD HEENT: Unremarkable Neck:  No JVD, no thyromegally Lymphatics:  No adenopathy Back:  No CVA tenderness Lungs:  Clear. Well-healed ICD incision. HEART:  Regular rate rhythm, a soft systolic murmur is present at the base. Abd:  soft, positive bowel sounds, no organomegally, no rebound, no guarding Ext:  2 plus pulses, no edema, no cyanosis, no clubbing Skin:  No rashes no nodules Neuro:  CN II through XII intact, motor grossly intact  DEVICE  Normal device function.  See PaceArt for details.   Assess/Plan:

## 2010-08-29 NOTE — Assessment & Plan Note (Signed)
His symptoms are class II. He will continue his current medical therapy and maintain a low-sodium diet. 

## 2010-10-20 ENCOUNTER — Telehealth: Payer: Self-pay | Admitting: *Deleted

## 2010-10-20 DIAGNOSIS — T82198A Other mechanical complication of other cardiac electronic device, initial encounter: Secondary | ICD-10-CM

## 2010-10-20 NOTE — Telephone Encounter (Signed)
Check lead

## 2010-11-17 LAB — POCT I-STAT 3, ART BLOOD GAS (G3+)
Acid-base deficit: 3 — ABNORMAL HIGH
Bicarbonate: 23.3
O2 Saturation: 94
TCO2: 25
pCO2 arterial: 44.1
pH, Arterial: 7.331 — ABNORMAL LOW
pO2, Arterial: 76 — ABNORMAL LOW

## 2010-11-17 LAB — POCT I-STAT 3, VENOUS BLOOD GAS (G3P V)
Bicarbonate: 24.6 — ABNORMAL HIGH
O2 Saturation: 70
Operator id: 221371
pCO2, Ven: 49.5
pO2, Ven: 41

## 2010-11-30 ENCOUNTER — Encounter: Payer: Medicare Other | Admitting: *Deleted

## 2010-12-07 ENCOUNTER — Encounter: Payer: Self-pay | Admitting: *Deleted

## 2011-01-11 ENCOUNTER — Telehealth: Payer: Self-pay | Admitting: *Deleted

## 2011-01-11 ENCOUNTER — Ambulatory Visit (INDEPENDENT_AMBULATORY_CARE_PROVIDER_SITE_OTHER): Payer: Medicare Other | Admitting: *Deleted

## 2011-01-11 DIAGNOSIS — I472 Ventricular tachycardia: Secondary | ICD-10-CM

## 2011-01-11 NOTE — Telephone Encounter (Signed)
Received call from patient's wife.  They received deliquent remote check notification.  They do not leave box hooked up all the time.  Her daughter will help her hook box up and try to transmit.  They will call after transmission sent to verify that we received it.  All questions answered.  Gypsy Balsam, RN, BSN 01/11/2011 11:56 AM

## 2011-01-12 ENCOUNTER — Other Ambulatory Visit: Payer: Self-pay | Admitting: Internal Medicine

## 2011-01-12 ENCOUNTER — Encounter: Payer: Self-pay | Admitting: Internal Medicine

## 2011-01-12 LAB — REMOTE ICD DEVICE
BRDY-0002RV: 40 {beats}/min
RV LEAD AMPLITUDE: 12 mv
RV LEAD IMPEDENCE ICD: 400 Ohm
TZAT-0004FASTVT: 8
TZAT-0004SLOWVT: 8
TZAT-0012FASTVT: 200 ms
TZAT-0013SLOWVT: 4
TZAT-0019FASTVT: 7.5 V
TZAT-0019SLOWVT: 7.5 V
TZON-0005FASTVT: 6
TZON-0010FASTVT: 40 ms
TZON-0010SLOWVT: 40 ms
TZST-0001FASTVT: 3
TZST-0001FASTVT: 4
TZST-0001SLOWVT: 2
TZST-0003FASTVT: 20 J
TZST-0003FASTVT: 36 J
TZST-0003SLOWVT: 40 J
VENTRICULAR PACING ICD: 1.1 pct

## 2011-01-25 NOTE — Progress Notes (Signed)
Remote icd check  

## 2011-02-07 ENCOUNTER — Encounter: Payer: Self-pay | Admitting: *Deleted

## 2011-04-19 ENCOUNTER — Encounter: Payer: Medicare Other | Admitting: *Deleted

## 2011-04-19 ENCOUNTER — Encounter: Payer: Self-pay | Admitting: Internal Medicine

## 2011-04-19 DIAGNOSIS — I428 Other cardiomyopathies: Secondary | ICD-10-CM

## 2011-04-19 LAB — REMOTE ICD DEVICE
DEV-0020ICD: NEGATIVE
DEVICE MODEL ICD: 788888
RV LEAD AMPLITUDE: 12 mv
RV LEAD IMPEDENCE ICD: 430 Ohm
TZAT-0001FASTVT: 1
TZAT-0001SLOWVT: 1
TZAT-0004FASTVT: 8
TZAT-0012SLOWVT: 200 ms
TZAT-0020SLOWVT: 1 ms
TZON-0003FASTVT: 300 ms
TZON-0003SLOWVT: 350 ms
TZON-0004FASTVT: 16
TZON-0004SLOWVT: 24
TZON-0005FASTVT: 6
TZON-0005SLOWVT: 6
TZON-0010FASTVT: 40 ms
TZST-0001FASTVT: 2
TZST-0001FASTVT: 3
TZST-0001FASTVT: 5
TZST-0001SLOWVT: 2
TZST-0001SLOWVT: 3
TZST-0003FASTVT: 36 J
TZST-0003FASTVT: 40 J
TZST-0003SLOWVT: 15 J
TZST-0003SLOWVT: 30 J
VENTRICULAR PACING ICD: 1 pct

## 2011-04-23 ENCOUNTER — Encounter: Payer: Self-pay | Admitting: *Deleted

## 2011-05-07 ENCOUNTER — Encounter: Payer: Self-pay | Admitting: *Deleted

## 2011-06-07 ENCOUNTER — Encounter: Payer: Medicare Other | Admitting: Internal Medicine

## 2011-06-19 ENCOUNTER — Ambulatory Visit (INDEPENDENT_AMBULATORY_CARE_PROVIDER_SITE_OTHER): Payer: Medicare Other | Admitting: Internal Medicine

## 2011-06-19 ENCOUNTER — Encounter: Payer: Self-pay | Admitting: Internal Medicine

## 2011-06-19 VITALS — BP 114/70 | HR 60 | Ht 69.0 in | Wt 194.8 lb

## 2011-06-19 DIAGNOSIS — I509 Heart failure, unspecified: Secondary | ICD-10-CM

## 2011-06-19 DIAGNOSIS — Z95810 Presence of automatic (implantable) cardiac defibrillator: Secondary | ICD-10-CM

## 2011-06-19 DIAGNOSIS — I2589 Other forms of chronic ischemic heart disease: Secondary | ICD-10-CM

## 2011-06-19 DIAGNOSIS — I472 Ventricular tachycardia: Secondary | ICD-10-CM

## 2011-06-19 LAB — ICD DEVICE OBSERVATION
AL AMPLITUDE: 5 mv
AL IMPEDENCE ICD: 362.5 Ohm
AL THRESHOLD: 0.75 V
BAMS-0001: 150 {beats}/min
BAMS-0003: 70 {beats}/min
BATTERY VOLTAGE: 2.9629 V
DEV-0020ICD: NEGATIVE
DEV-0020ICD: NEGATIVE
DEVICE MODEL ICD: 559903
FVT: 0
HV IMPEDENCE: 42 Ohm
PACEART VT: 0
RV LEAD AMPLITUDE: 11.4 mv
RV LEAD AMPLITUDE: 4.4 mv
RV LEAD IMPEDENCE ICD: 425 Ohm
RV LEAD THRESHOLD: 0.75 V
RV LEAD THRESHOLD: 0.75 V
TOT-0006: 20100224000000
TOT-0008: 0
TOT-0009: 1
TOT-0010: 6
TZAT-0001FASTVT: 1
TZAT-0004FASTVT: 8
TZAT-0004SLOWVT: 8
TZAT-0012FASTVT: 200 ms
TZAT-0018FASTVT: NEGATIVE
TZAT-0018SLOWVT: NEGATIVE
TZAT-0019SLOWVT: 7.5 V
TZAT-0020FASTVT: 1 ms
TZAT-0020SLOWVT: 1 ms
TZON-0004SLOWVT: 24
TZON-0005FASTVT: 6
TZST-0001SLOWVT: 2
TZST-0001SLOWVT: 3
TZST-0001SLOWVT: 4
TZST-0003FASTVT: 20 J
TZST-0003FASTVT: 36 J
TZST-0003FASTVT: 40 J
TZST-0003SLOWVT: 30 J
VENTRICULAR PACING ICD: 0.14 pct
VF: 0

## 2011-06-19 NOTE — Assessment & Plan Note (Signed)
His device is working normally. We'll plan to recheck in several months. 

## 2011-06-19 NOTE — Assessment & Plan Note (Signed)
He denies anginal symptoms. He will remain with his current level of activity and continue his current medical therapy.

## 2011-06-19 NOTE — Assessment & Plan Note (Signed)
He has had no recurrent ventricular arrhythmias. No change in medical therapy. 

## 2011-06-19 NOTE — Progress Notes (Signed)
HPI [name] returns today for followup. He is a very pleasant 77 year old male with an ischemic cardiomyopathy, chronic systolic heart failure, ventricular tachycardia, status post ICD implantation. In the interim, he has done well. He denies chest pain, shortness of breath, or peripheral edema. He remains active. He has had no ICD shocks. No Known Allergies   Current Outpatient Prescriptions  Medication Sig Dispense Refill  . aspirin 81 MG tablet Take 81 mg by mouth daily. Take two tablets daily.      . carvedilol (COREG) 3.125 MG tablet Take 3.125 mg by mouth 2 (two) times daily with a meal.        . lisinopril (PRINIVIL,ZESTRIL) 20 MG tablet Take 20 mg by mouth 2 (two) times daily.       . niacin (NIASPAN) 1000 MG CR tablet Take 500 mg by mouth at bedtime.       . nitroGLYCERIN (NITROSTAT) 0.4 MG SL tablet Place 0.4 mg under the tongue every 5 (five) minutes as needed.        Marland Kitchen omeprazole (PRILOSEC) 20 MG capsule Take 20 mg by mouth daily.      . pravastatin (PRAVACHOL) 20 MG tablet Take 20 mg by mouth daily.        . cetirizine (ZYRTEC) 10 MG tablet Take 10 mg by mouth daily.           Past Medical History  Diagnosis Date  . HTN (hypertension)   . Cardiomyopathy, ischemic   . Presence of permanent cardiac pacemaker   . CHF (congestive heart failure)   . Myocardial infarction     ROS:   All systems reviewed and negative except as noted in the HPI.   Past Surgical History  Procedure Date  . Angioplasty / stenting femoral   . Cardiac defibrillator placement     ICD-St Jude     No family history on file.   History   Social History  . Marital Status: Married    Spouse Name: N/A    Number of Children: N/A  . Years of Education: N/A   Occupational History  . Not on file.   Social History Main Topics  . Smoking status: Former Games developer  . Smokeless tobacco: Not on file  . Alcohol Use: Not on file  . Drug Use: Not on file  . Sexually Active: Not on file    Other Topics Concern  . Not on file   Social History Narrative  . No narrative on file     BP 114/70  Pulse 60  Ht 5\' 9"  (1.753 m)  Wt 194 lb 12.8 oz (88.361 kg)  BMI 28.77 kg/m2  Physical Exam:  Well appearing 77 year old man, NAD HEENT: Unremarkable Neck:  No JVD, no thyromegally Lungs:  Clear with no wheezes, rales, or rhonchi. Well-healed ICD incision. HEART:  Regular rate rhythm, no murmurs, no rubs, no clicks Abd:  soft, positive bowel sounds, no organomegally, no rebound, no guarding Ext:  2 plus pulses, no edema, no cyanosis, no clubbing Skin:  No rashes no nodules Neuro:  CN II through XII intact, motor grossly intact  DEVICE  Normal device function.  See PaceArt for details.   Assess/Plan:

## 2011-06-19 NOTE — Patient Instructions (Addendum)
Your physician wants you to follow-up in: 12 months with Dr Taylor You will receive a reminder letter in the mail two months in advance. If you don't receive a letter, please call our office to schedule the follow-up appointment.   Remote monitoring is used to monitor your Pacemaker of ICD from home. This monitoring reduces the number of office visits required to check your device to one time per year. It allows us to keep an eye on the functioning of your device to ensure it is working properly. You are scheduled for a device check from home on 09/27/11. You may send your transmission at any time that day. If you have a wireless device, the transmission will be sent automatically. After your physician reviews your transmission, you will receive a postcard with your next transmission date.   

## 2011-09-03 ENCOUNTER — Telehealth: Payer: Self-pay | Admitting: Internal Medicine

## 2011-09-03 NOTE — Telephone Encounter (Signed)
Caller: [name]/Patient; PCP: Oliver Barre; CB#: [phone];  Call regarding Edema in Ankles , Feet and claves He has had swelling since he started taking The Testosterone CYP Inj and taking Q Thurs.  Last inj was 08/30/11.  Legs are very tight  esp at night.   Pt ahs AF and scheduled for an Ablation 10/03/11. Triaged Edema Autraumatic and all emergent SX R/O.  Disp = needs to be seen in 24 hrs. Appt made with John at 1600 09/04/11. Home care and call back  inst given.

## 2011-09-04 ENCOUNTER — Ambulatory Visit: Payer: Medicare Other | Admitting: Internal Medicine

## 2011-09-27 ENCOUNTER — Encounter: Payer: Medicare Other | Admitting: *Deleted

## 2011-10-01 ENCOUNTER — Encounter: Payer: Self-pay | Admitting: *Deleted

## 2012-02-14 ENCOUNTER — Ambulatory Visit (INDEPENDENT_AMBULATORY_CARE_PROVIDER_SITE_OTHER): Payer: Medicare Other | Admitting: *Deleted

## 2012-02-14 ENCOUNTER — Encounter: Payer: Self-pay | Admitting: *Deleted

## 2012-02-14 ENCOUNTER — Encounter: Payer: Self-pay | Admitting: Internal Medicine

## 2012-02-14 DIAGNOSIS — Z95810 Presence of automatic (implantable) cardiac defibrillator: Secondary | ICD-10-CM

## 2012-02-14 DIAGNOSIS — I472 Ventricular tachycardia, unspecified: Secondary | ICD-10-CM

## 2012-02-14 DIAGNOSIS — I509 Heart failure, unspecified: Secondary | ICD-10-CM

## 2012-02-15 LAB — REMOTE ICD DEVICE
DEV-0020ICD: NEGATIVE
HV IMPEDENCE: 54 Ohm
RV LEAD AMPLITUDE: 12 mv
TZAT-0001FASTVT: 1
TZAT-0001SLOWVT: 1
TZAT-0004SLOWVT: 8
TZAT-0012FASTVT: 200 ms
TZAT-0012SLOWVT: 200 ms
TZAT-0013FASTVT: 1
TZAT-0019FASTVT: 7.5 V
TZAT-0019SLOWVT: 7.5 V
TZON-0003FASTVT: 300 ms
TZON-0003SLOWVT: 350 ms
TZON-0004SLOWVT: 24
TZON-0005SLOWVT: 6
TZON-0010FASTVT: 40 ms
TZON-0010SLOWVT: 40 ms
TZST-0001FASTVT: 2
TZST-0001FASTVT: 3
TZST-0001FASTVT: 5
TZST-0001SLOWVT: 3
TZST-0001SLOWVT: 4
TZST-0001SLOWVT: 5
TZST-0003FASTVT: 20 J
TZST-0003FASTVT: 36 J
TZST-0003SLOWVT: 30 J
TZST-0003SLOWVT: 40 J
VENTRICULAR PACING ICD: 1 pct

## 2012-02-20 ENCOUNTER — Encounter: Payer: Self-pay | Admitting: *Deleted

## 2012-02-27 ENCOUNTER — Encounter: Payer: Self-pay | Admitting: Internal Medicine

## 2012-05-28 ENCOUNTER — Encounter: Payer: Medicare Other | Admitting: Internal Medicine

## 2012-05-29 ENCOUNTER — Encounter: Payer: Self-pay | Admitting: Internal Medicine

## 2012-06-19 ENCOUNTER — Encounter: Payer: Medicare Other | Admitting: Cardiology

## 2012-06-26 ENCOUNTER — Ambulatory Visit (INDEPENDENT_AMBULATORY_CARE_PROVIDER_SITE_OTHER): Payer: Medicare Other | Admitting: Cardiology

## 2012-06-26 ENCOUNTER — Encounter: Payer: Self-pay | Admitting: Cardiology

## 2012-06-26 VITALS — BP 128/72 | HR 72 | Ht 69.0 in | Wt 196.0 lb

## 2012-06-26 DIAGNOSIS — I509 Heart failure, unspecified: Secondary | ICD-10-CM

## 2012-06-26 DIAGNOSIS — I10 Essential (primary) hypertension: Secondary | ICD-10-CM

## 2012-06-26 DIAGNOSIS — Z95810 Presence of automatic (implantable) cardiac defibrillator: Secondary | ICD-10-CM

## 2012-06-26 DIAGNOSIS — I472 Ventricular tachycardia, unspecified: Secondary | ICD-10-CM

## 2012-06-26 DIAGNOSIS — I2589 Other forms of chronic ischemic heart disease: Secondary | ICD-10-CM

## 2012-06-26 DIAGNOSIS — I2510 Atherosclerotic heart disease of native coronary artery without angina pectoris: Secondary | ICD-10-CM

## 2012-06-26 DIAGNOSIS — I5022 Chronic systolic (congestive) heart failure: Secondary | ICD-10-CM

## 2012-06-26 DIAGNOSIS — I255 Ischemic cardiomyopathy: Secondary | ICD-10-CM

## 2012-06-26 LAB — ICD DEVICE OBSERVATION
BRDY-0002RV: 40 {beats}/min
DEV-0020ICD: NEGATIVE
DEVICE MODEL ICD: 788888
HV IMPEDENCE: 55 Ohm
PACEART VT: 0
RV LEAD AMPLITUDE: 12 mv
RV LEAD IMPEDENCE ICD: 450 Ohm
RV LEAD THRESHOLD: 0.75 V
TZAT-0001FASTVT: 1
TZAT-0012FASTVT: 200 ms
TZAT-0013SLOWVT: 4
TZAT-0019FASTVT: 7.5 V
TZAT-0019SLOWVT: 7.5 V
TZAT-0020FASTVT: 1 ms
TZON-0003SLOWVT: 350 ms
TZON-0004SLOWVT: 24
TZON-0005SLOWVT: 6
TZON-0010SLOWVT: 40 ms
TZST-0001FASTVT: 2
TZST-0001FASTVT: 4
TZST-0001SLOWVT: 4
TZST-0001SLOWVT: 5
TZST-0003FASTVT: 40 J
TZST-0003SLOWVT: 40 J
VENTRICULAR PACING ICD: 0 pct
VF: 0

## 2012-06-26 NOTE — Patient Instructions (Signed)
Remote monitoring is used to monitor your Pacemaker of ICD from home. This monitoring reduces the number of office visits required to check your device to one time per year. It allows us to keep an eye on the functioning of your device to ensure it is working properly. You are scheduled for a device check from home on 09/29/2012. You may send your transmission at any time that day. If you have a wireless device, the transmission will be sent automatically. After your physician reviews your transmission, you will receive a postcard with your next transmission date.  Your physician wants you to follow-up in: 1 year with Dr. Taylor. You will receive a reminder letter in the mail two months in advance. If you don't receive a letter, please call our office to schedule the follow-up appointment.  Your physician recommends that you continue on your current medications as directed. Please refer to the Current Medication list given to you today.    

## 2012-06-26 NOTE — Progress Notes (Signed)
ELECTROPHYSIOLOGY OFFICE NOTE  Patient ID: [name] MRN: [id number], DOB/AGE: [birth year]   Date of Visit: 06/26/2012  Primary Physician: [place] Reid, MD Primary Cardiologist: Ladona Ridgel, MD Reason for Visit: EP/device follow-up  History of Present Illness  [name] is a 78 year old man with an ischemic cardiomyopathy, chronic systolic heart failure and ventricular tachycardia status post ICD implantation who presents today for routine electrophysiology followup. Since last being seen in our clinic, he reports he is doing well. He has no complaints. He has chronic, mild DOE but states this is stable, not worsening and does not limit his actitivities. He denies chest pain. He denies palpitations, dizziness, near syncope or syncope. He denies LE swelling, orthopnea, PND or recent weight gain. [name] reports that he is compliant and tolerating medications without difficulty.  Past Medical History Past Medical History  Diagnosis Date  . HTN (hypertension)   . Cardiomyopathy, ischemic   . Presence of permanent cardiac pacemaker   . CHF (congestive heart failure)   . Myocardial infarction     Past Surgical History Past Surgical History  Procedure Laterality Date  . Angioplasty / stenting femoral    . Cardiac defibrillator placement      ICD-St Jude    Allergies/Intolerances No Known Allergies  Current Home Medications Current Outpatient Prescriptions  Medication Sig Dispense Refill  . aspirin 81 MG tablet Take 81 mg by mouth daily. Take two tablets daily.      . carvedilol (COREG) 3.125 MG tablet Take 3.125 mg by mouth 2 (two) times daily with a meal.        . cetirizine (ZYRTEC) 10 MG tablet Take 10 mg by mouth daily.        Marland Kitchen lisinopril (PRINIVIL,ZESTRIL) 20 MG tablet Take 20 mg by mouth 2 (two) times daily.       . niacin (NIASPAN) 1000 MG CR tablet Take 500 mg by mouth at bedtime.       . nitroGLYCERIN (NITROSTAT) 0.4 MG SL tablet Place 0.4 mg under the tongue  every 5 (five) minutes as needed.        Marland Kitchen omeprazole (PRILOSEC) 20 MG capsule Take 20 mg by mouth daily.      . pravastatin (PRAVACHOL) 20 MG tablet Take 20 mg by mouth daily. PCP will be changing to Lipitor in 5 weeks.       No current facility-administered medications for this visit.   Social History Social History  . Marital Status: Married   Social History Main Topics  . Smoking status: Former Games developer  . Smokeless tobacco: No  . Alcohol Use: No  . Drug Use: No   Review of Systems General: No chills, fever, night sweats or weight changes Cardiovascular: No chest pain, dyspnea on exertion, edema, orthopnea, palpitations, paroxysmal nocturnal dyspnea Dermatological: No rash, lesions or masses Respiratory: No cough, dyspnea Urologic: No hematuria, dysuria Abdominal: No nausea, vomiting, diarrhea, bright red blood per rectum, melena, or hematemesis Neurologic: No visual changes, weakness, changes in mental status All other systems reviewed and are otherwise negative except as noted above.  Physical Exam Blood pressure 128/72, pulse 72, height 5\' 9"  (1.753 m), weight 196 lb (88.905 kg).  General: Well developed, well appearing, in no acute distress. HEENT: Normocephalic, atraumatic. EOMs intact. Sclera nonicteric. Oropharynx clear.  Neck: Supple without bruits. No JVD. Lungs: Respirations regular and unlabored, CTA bilaterally. No wheezes, rales or rhonchi. Heart: RRR. S1, S2 present. No murmurs, rub, S3 or S4. Abdomen: Soft, non-tender,  non-distended. BS present x 4 quadrants. No hepatosplenomegaly.  Extremities: No clubbing, cyanosis or edema. DP/PT/Radials 2+ and equal bilaterally. Psych: Normal affect. Neuro: Alert and oriented X 3. Moves all extremities spontaneously.   Diagnostics Device interrogation today - Normal device function. Threshold and sensing consistent with previous device measurements. Impedance trends stable over time. No evidence of any ventricular  arrhythmias. Histogram distribution appropriate for patient and level of activity. CorVue reviewed and stable. No changes made this session. Device programmed at appropriate safety margins. Device programmed to optimize intrinsic conduction. Estimated longevity 7.6 years.   Assessment and Plan 1. Ischemic CM s/p ICD implant Normal device function No programming changes made Continue routine remote device follow-up every 3 months Return to clinic for follow-up with me or Dr. Ladona Ridgel in one year 2. Paroxysmal VT Stable; no recurrence/episodes by device interrogation today Continue BB 3. ICM with chronic systolic HF Stable; euvolemic by exam today CorVue reviewed and stable Continue medical therapy with carvedilol and lisinopril 4. CAD Stable without anginal symptoms Continue medical therapy with ASA, BB and statin 5. HTN Initial BP taken immediately after walking down the hallway Repeat BP while sitting improved and normal Continue current antihypertensive regimen  Signed, Isaiahs Chancy, PA-C 06/26/2012, 3:06 PM

## 2012-07-23 ENCOUNTER — Encounter: Payer: Self-pay | Admitting: Internal Medicine

## 2012-09-01 ENCOUNTER — Telehealth: Payer: Self-pay | Admitting: Internal Medicine

## 2012-09-01 NOTE — Telephone Encounter (Signed)
New problem   [facility] need sx recommendations for pt's ICD. Please call or [phone]).

## 2012-09-04 NOTE — Telephone Encounter (Signed)
Tried to return call to [place]. No extension was left so unable to reach. Fax was sent needing more information and requested return call/kwm

## 2012-09-29 ENCOUNTER — Ambulatory Visit (INDEPENDENT_AMBULATORY_CARE_PROVIDER_SITE_OTHER): Payer: Medicare Other | Admitting: *Deleted

## 2012-09-29 ENCOUNTER — Encounter: Payer: Self-pay | Admitting: Internal Medicine

## 2012-09-29 DIAGNOSIS — I472 Ventricular tachycardia, unspecified: Secondary | ICD-10-CM

## 2012-09-29 DIAGNOSIS — Z95810 Presence of automatic (implantable) cardiac defibrillator: Secondary | ICD-10-CM

## 2012-09-29 DIAGNOSIS — I509 Heart failure, unspecified: Secondary | ICD-10-CM

## 2012-09-30 LAB — REMOTE ICD DEVICE
BRDY-0002RV: 40 {beats}/min
HV IMPEDENCE: 58 Ohm
RV LEAD IMPEDENCE ICD: 450 Ohm
TZAT-0001FASTVT: 1
TZAT-0004SLOWVT: 8
TZAT-0012SLOWVT: 200 ms
TZAT-0013SLOWVT: 4
TZAT-0018SLOWVT: NEGATIVE
TZAT-0019SLOWVT: 7.5 V
TZAT-0020FASTVT: 1 ms
TZAT-0020SLOWVT: 1 ms
TZON-0003SLOWVT: 350 ms
TZON-0010FASTVT: 40 ms
TZST-0001FASTVT: 2
TZST-0001FASTVT: 4
TZST-0001FASTVT: 5
TZST-0001SLOWVT: 3
TZST-0001SLOWVT: 4
TZST-0001SLOWVT: 5
TZST-0003FASTVT: 40 J
TZST-0003SLOWVT: 15 J
VENTRICULAR PACING ICD: 1 pct

## 2012-10-29 ENCOUNTER — Encounter: Payer: Self-pay | Admitting: *Deleted

## 2013-01-05 ENCOUNTER — Encounter: Payer: Medicare Other | Admitting: *Deleted

## 2013-01-05 ENCOUNTER — Encounter: Payer: Self-pay | Admitting: Internal Medicine

## 2013-01-05 ENCOUNTER — Ambulatory Visit (INDEPENDENT_AMBULATORY_CARE_PROVIDER_SITE_OTHER): Payer: Medicare Other

## 2013-01-05 DIAGNOSIS — I472 Ventricular tachycardia, unspecified: Secondary | ICD-10-CM

## 2013-01-05 DIAGNOSIS — I509 Heart failure, unspecified: Secondary | ICD-10-CM

## 2013-01-13 ENCOUNTER — Encounter: Payer: Self-pay | Admitting: *Deleted

## 2013-01-19 ENCOUNTER — Encounter: Payer: Medicare Other | Admitting: *Deleted

## 2013-01-25 LAB — MDC_IDC_ENUM_SESS_TYPE_REMOTE: Implantable Pulse Generator Serial Number: 788888

## 2013-02-02 ENCOUNTER — Encounter: Payer: Self-pay | Admitting: *Deleted

## 2013-03-21 ENCOUNTER — Inpatient Hospital Stay (HOSPITAL_COMMUNITY): Payer: Medicare Other

## 2013-03-21 ENCOUNTER — Encounter (HOSPITAL_COMMUNITY): Payer: Self-pay

## 2013-03-21 ENCOUNTER — Inpatient Hospital Stay (HOSPITAL_COMMUNITY)
Admission: AD | Admit: 2013-03-21 | Discharge: 2013-03-25 | DRG: 291 | Disposition: A | Discharge status: Home / Self Care | Payer: Medicare Other | Source: Other Acute Inpatient Hospital | Attending: Internal Medicine | Admitting: Internal Medicine

## 2013-03-21 DIAGNOSIS — I509 Heart failure, unspecified: Secondary | ICD-10-CM | POA: Diagnosis present

## 2013-03-21 DIAGNOSIS — I2589 Other forms of chronic ischemic heart disease: Secondary | ICD-10-CM | POA: Diagnosis present

## 2013-03-21 DIAGNOSIS — J9600 Acute respiratory failure, unspecified whether with hypoxia or hypercapnia: Secondary | ICD-10-CM | POA: Diagnosis present

## 2013-03-21 DIAGNOSIS — R7989 Other specified abnormal findings of blood chemistry: Secondary | ICD-10-CM | POA: Diagnosis present

## 2013-03-21 DIAGNOSIS — R1310 Dysphagia, unspecified: Secondary | ICD-10-CM | POA: Diagnosis present

## 2013-03-21 DIAGNOSIS — I2489 Other forms of acute ischemic heart disease: Secondary | ICD-10-CM | POA: Diagnosis present

## 2013-03-21 DIAGNOSIS — I4729 Other ventricular tachycardia: Secondary | ICD-10-CM | POA: Diagnosis present

## 2013-03-21 DIAGNOSIS — I248 Other forms of acute ischemic heart disease: Secondary | ICD-10-CM | POA: Diagnosis present

## 2013-03-21 DIAGNOSIS — I252 Old myocardial infarction: Secondary | ICD-10-CM

## 2013-03-21 DIAGNOSIS — I5189 Other ill-defined heart diseases: Secondary | ICD-10-CM | POA: Diagnosis present

## 2013-03-21 DIAGNOSIS — I5022 Chronic systolic (congestive) heart failure: Secondary | ICD-10-CM | POA: Diagnosis present

## 2013-03-21 DIAGNOSIS — R778 Other specified abnormalities of plasma proteins: Secondary | ICD-10-CM | POA: Diagnosis present

## 2013-03-21 DIAGNOSIS — Z87891 Personal history of nicotine dependence: Secondary | ICD-10-CM

## 2013-03-21 DIAGNOSIS — N179 Acute kidney failure, unspecified: Secondary | ICD-10-CM | POA: Diagnosis present

## 2013-03-21 DIAGNOSIS — I10 Essential (primary) hypertension: Secondary | ICD-10-CM | POA: Diagnosis present

## 2013-03-21 DIAGNOSIS — I5023 Acute on chronic systolic (congestive) heart failure: Principal | ICD-10-CM | POA: Diagnosis present

## 2013-03-21 DIAGNOSIS — I2510 Atherosclerotic heart disease of native coronary artery without angina pectoris: Secondary | ICD-10-CM | POA: Diagnosis present

## 2013-03-21 DIAGNOSIS — T502X5A Adverse effect of carbonic-anhydrase inhibitors, benzothiadiazides and other diuretics, initial encounter: Secondary | ICD-10-CM | POA: Diagnosis present

## 2013-03-21 DIAGNOSIS — I472 Ventricular tachycardia, unspecified: Secondary | ICD-10-CM | POA: Diagnosis present

## 2013-03-21 DIAGNOSIS — E785 Hyperlipidemia, unspecified: Secondary | ICD-10-CM | POA: Diagnosis present

## 2013-03-21 DIAGNOSIS — J209 Acute bronchitis, unspecified: Secondary | ICD-10-CM | POA: Diagnosis present

## 2013-03-21 DIAGNOSIS — I219 Acute myocardial infarction, unspecified: Secondary | ICD-10-CM

## 2013-03-21 DIAGNOSIS — I513 Intracardiac thrombosis, not elsewhere classified: Secondary | ICD-10-CM | POA: Diagnosis present

## 2013-03-21 DIAGNOSIS — J111 Influenza due to unidentified influenza virus with other respiratory manifestations: Secondary | ICD-10-CM | POA: Diagnosis present

## 2013-03-21 DIAGNOSIS — R799 Abnormal finding of blood chemistry, unspecified: Secondary | ICD-10-CM

## 2013-03-21 DIAGNOSIS — Z95810 Presence of automatic (implantable) cardiac defibrillator: Secondary | ICD-10-CM

## 2013-03-21 HISTORY — DX: Intracardiac thrombosis, not elsewhere classified: I51.3

## 2013-03-21 LAB — CREATININE, SERUM
Creatinine, Ser: 1.46 mg/dL — ABNORMAL HIGH (ref 0.50–1.35)
GFR, EST AFRICAN AMERICAN: 52 mL/min — AB (ref >90)
GFR, EST NON AFRICAN AMERICAN: 45 mL/min — AB (ref >90)

## 2013-03-21 LAB — MRSA PCR SCREENING: MRSA BY PCR: NEGATIVE

## 2013-03-21 LAB — CBC
HEMATOCRIT: 45 % (ref 39.0–52.0)
HEMOGLOBIN: 15.4 g/dL (ref 13.0–17.0)
MCH: 31.9 pg (ref 26.0–34.0)
MCHC: 34.2 g/dL (ref 30.0–36.0)
MCV: 93.2 fL (ref 78.0–100.0)
Platelets: 140 10*3/uL — ABNORMAL LOW (ref 150–400)
RBC: 4.83 MIL/uL (ref 4.22–5.81)
RDW: 13.4 % (ref 11.5–15.5)
WBC: 9.4 10*3/uL (ref 4.0–10.5)

## 2013-03-21 LAB — COMPREHENSIVE METABOLIC PANEL
ALBUMIN: 3.7 g/dL (ref 3.5–5.2)
ALT: 15 U/L (ref 0–53)
AST: 19 U/L (ref 0–37)
Alkaline Phosphatase: 131 U/L — ABNORMAL HIGH (ref 39–117)
BUN: 14 mg/dL (ref 6–23)
CALCIUM: 9 mg/dL (ref 8.4–10.5)
CO2: 28 mEq/L (ref 19–32)
CREATININE: 1.24 mg/dL (ref 0.50–1.35)
Chloride: 95 mEq/L — ABNORMAL LOW (ref 96–112)
GFR calc Af Amer: 63 mL/min — ABNORMAL LOW (ref >90)
GFR calc non Af Amer: 55 mL/min — ABNORMAL LOW (ref >90)
Glucose, Bld: 125 mg/dL — ABNORMAL HIGH (ref 70–99)
Potassium: 3.9 mEq/L (ref 3.7–5.3)
Sodium: 136 mEq/L — ABNORMAL LOW (ref 137–147)
TOTAL PROTEIN: 7.8 g/dL (ref 6.0–8.3)
Total Bilirubin: 0.4 mg/dL (ref 0.3–1.2)

## 2013-03-21 LAB — PRO B NATRIURETIC PEPTIDE: PRO B NATRI PEPTIDE: 1120 pg/mL — AB (ref 0–450)

## 2013-03-21 LAB — TROPONIN I: Troponin I: <0.3 ng/mL (ref <0.30)

## 2013-03-21 LAB — LACTIC ACID, PLASMA: LACTIC ACID, VENOUS: 1.8 mmol/L (ref 0.5–2.2)

## 2013-03-21 MED ORDER — SIMVASTATIN 10 MG PO TABS
10.0000 mg | ORAL_TABLET | Freq: Every day | ORAL | Status: DC
  Administered 2013-03-21 – 2013-03-24 (×4): 10 mg via ORAL
  Filled 2013-03-21 (×6): qty 1

## 2013-03-21 MED ORDER — FUROSEMIDE 10 MG/ML IJ SOLN
40.0000 mg | Freq: Every day | INTRAMUSCULAR | Status: DC
  Filled 2013-03-21: qty 4

## 2013-03-21 MED ORDER — CARVEDILOL 3.125 MG PO TABS
3.1250 mg | ORAL_TABLET | Freq: Two times a day (BID) | ORAL | Status: DC
  Administered 2013-03-21 – 2013-03-24 (×5): 3.125 mg via ORAL
  Filled 2013-03-21 (×11): qty 1

## 2013-03-21 MED ORDER — ASPIRIN 81 MG PO TABS
162.0000 mg | ORAL_TABLET | Freq: Every day | ORAL | Status: DC
  Administered 2013-03-21 – 2013-03-22 (×2): 162 mg via ORAL
  Filled 2013-03-21 (×5): qty 2

## 2013-03-21 MED ORDER — ACETAMINOPHEN 325 MG PO TABS: 650.0000 mg | ORAL_TABLET | Freq: Four times a day (QID) | ORAL | Status: DC | PRN

## 2013-03-21 MED ORDER — ONDANSETRON HCL 4 MG/2ML IJ SOLN: 4.0000 mg | Freq: Four times a day (QID) | INTRAMUSCULAR | Status: DC | PRN

## 2013-03-21 MED ORDER — ALBUTEROL SULFATE (2.5 MG/3ML) 0.083% IN NEBU: 2.5000 mg | INHALATION_SOLUTION | RESPIRATORY_TRACT | Status: DC | PRN

## 2013-03-21 MED ORDER — SODIUM CHLORIDE 0.9 % IJ SOLN
3.0000 mL | Freq: Two times a day (BID) | INTRAMUSCULAR | Status: DC
  Administered 2013-03-21 – 2013-03-24 (×7): 3 mL via INTRAVENOUS

## 2013-03-21 MED ORDER — FENTANYL CITRATE 0.05 MG/ML IJ SOLN
12.5000 ug | Freq: Once | INTRAMUSCULAR | Status: AC
  Administered 2013-03-21: 12.5 ug via INTRAVENOUS
  Filled 2013-03-21: qty 2

## 2013-03-21 MED ORDER — GUAIFENESIN-DM 100-10 MG/5ML PO SYRP: 5.0000 mL | ORAL_SOLUTION | ORAL | Status: DC | PRN

## 2013-03-21 MED ORDER — SODIUM CHLORIDE 0.9 % IJ SOLN
3.0000 mL | Freq: Two times a day (BID) | INTRAMUSCULAR | Status: DC
  Administered 2013-03-21 – 2013-03-24 (×4): 3 mL via INTRAVENOUS

## 2013-03-21 MED ORDER — ALUM & MAG HYDROXIDE-SIMETH 200-200-20 MG/5ML PO SUSP: 30.0000 mL | Freq: Four times a day (QID) | ORAL | Status: DC | PRN

## 2013-03-21 MED ORDER — ENOXAPARIN SODIUM 40 MG/0.4ML ~~LOC~~ SOLN
40.0000 mg | SUBCUTANEOUS | Status: DC
  Administered 2013-03-21 – 2013-03-23 (×3): 40 mg via SUBCUTANEOUS
  Filled 2013-03-21 (×3): qty 0.4

## 2013-03-21 MED ORDER — SODIUM CHLORIDE 0.9 % IJ SOLN: 3.0000 mL | INTRAMUSCULAR | Status: DC | PRN

## 2013-03-21 MED ORDER — NIACIN ER (ANTIHYPERLIPIDEMIC) 1000 MG PO TBCR: 500.0000 mg | EXTENDED_RELEASE_TABLET | Freq: Every day | ORAL | Status: DC

## 2013-03-21 MED ORDER — LEVOFLOXACIN IN D5W 500 MG/100ML IV SOLN
500.0000 mg | INTRAVENOUS | Status: DC
  Administered 2013-03-21: 500 mg via INTRAVENOUS
  Filled 2013-03-21 (×2): qty 100

## 2013-03-21 MED ORDER — SODIUM CHLORIDE 0.9 % IV SOLN: 250.0000 mL | INTRAVENOUS | Status: DC | PRN

## 2013-03-21 MED ORDER — ACETAMINOPHEN 650 MG RE SUPP: 650.0000 mg | Freq: Four times a day (QID) | RECTAL | Status: DC | PRN

## 2013-03-21 MED ORDER — IPRATROPIUM-ALBUTEROL 0.5-2.5 (3) MG/3ML IN SOLN
3.0000 mL | Freq: Four times a day (QID) | RESPIRATORY_TRACT | Status: DC
  Administered 2013-03-21 – 2013-03-22 (×6): 3 mL via RESPIRATORY_TRACT
  Filled 2013-03-21 (×6): qty 3

## 2013-03-21 MED ORDER — LISINOPRIL 40 MG PO TABS
40.0000 mg | ORAL_TABLET | Freq: Every day | ORAL | Status: DC
  Administered 2013-03-21 – 2013-03-23 (×3): 40 mg via ORAL
  Filled 2013-03-21 (×4): qty 1

## 2013-03-21 MED ORDER — ONDANSETRON HCL 4 MG PO TABS: 4.0000 mg | ORAL_TABLET | Freq: Four times a day (QID) | ORAL | Status: DC | PRN

## 2013-03-21 MED ORDER — NITROGLYCERIN 0.4 MG SL SUBL: 0.4000 mg | SUBLINGUAL_TABLET | SUBLINGUAL | Status: DC | PRN

## 2013-03-21 MED ORDER — NIACIN ER (ANTIHYPERLIPIDEMIC) 500 MG PO TBCR
500.0000 mg | EXTENDED_RELEASE_TABLET | Freq: Every day | ORAL | Status: DC
  Administered 2013-03-21 – 2013-03-24 (×4): 500 mg via ORAL
  Filled 2013-03-21 (×7): qty 1

## 2013-03-21 MED ORDER — PANTOPRAZOLE SODIUM 40 MG PO TBEC
40.0000 mg | DELAYED_RELEASE_TABLET | Freq: Every day | ORAL | Status: DC
  Administered 2013-03-21 – 2013-03-25 (×4): 40 mg via ORAL
  Filled 2013-03-21 (×4): qty 1

## 2013-03-21 MED ORDER — METHYLPREDNISOLONE SODIUM SUCC 40 MG IJ SOLR
40.0000 mg | Freq: Two times a day (BID) | INTRAMUSCULAR | Status: DC
  Administered 2013-03-21 – 2013-03-22 (×2): 40 mg via INTRAVENOUS
  Filled 2013-03-21 (×4): qty 1

## 2013-03-21 NOTE — Consult Note (Signed)
Cardiac Consult Note Date: 03/21/2013  Patient name: [name] Medical record number: [id number] Date of birth: [birth year] Age: 79 y.o. Gender: male PCP: DanielINDIA, MD  Medical Service: Attending name:Dr. Lovell [name]  Reason for consult: elevated troponin  History of Present Illness: [name] is a 79 yo man pmh of ischemic cardiomyopathy, VT s/p ICD 2005, CAD/MI 1992 with PTCA of LAD complicated by reocclusion and recanalization, previous RCA stending LHC 01/2000 by Dr. Riley [name] EF 45%, 30-40% LAD stenosis, LCX normal, patent RCA 3.0 x 18 mm [address] stent, 50-70% stenosis distal RCA, 40% PLA, last cardiolite study 01/2000 extensive anterior/apical scar, no ischemia, EF 27%, chronic systolic HF on medical therapy, HTN followed by Dr. Ladona [name] who presented at outside hospital with some ongoing congestion, productive cough, and elevated troponin 0.08. Last device interrogation 5/14 w/o recurrence/episodes of paroxysmal VT. Pt states symptoms began on 03/19/13 with some fevers/chills, myalgias, arthralgias, decreased appetite, and productive cough. He is not aware of any sick contacts. He denied any CP, has some DOE and SOB since coughing began, but no ICD firing or palpitations. Pt has not had LE edema or decreased UO or po intake. He is not a smoker and has been compliant with his medications.   Meds: Medications Prior to Admission  Medication Sig Dispense Refill  . aspirin 81 MG tablet Take 162 mg by mouth at bedtime.       . carvedilol (COREG) 3.125 MG tablet Take 3.125 mg by mouth 2 (two) times daily with a meal.        . lisinopril (PRINIVIL,ZESTRIL) 20 MG tablet Take 40 mg by mouth daily.       . niacin (NIASPAN) 1000 MG CR tablet Take 500 mg by mouth at bedtime.       . nitroGLYCERIN (NITROSTAT) 0.4 MG SL tablet Place 0.4 mg under the tongue every 5 (five) minutes as needed.        Marland Kitchen omeprazole (PRILOSEC) 20 MG capsule Take 20 mg by mouth daily.      . pravastatin (PRAVACHOL) 20 MG  tablet Take 20 mg by mouth daily. PCP will be changing to Lipitor in 5 weeks.       Allergies: Review of patient's allergies indicates no known allergies. Past Medical History  Diagnosis Date  . HTN (hypertension)   . Cardiomyopathy, ischemic   . Presence of permanent cardiac pacemaker   . CHF (congestive heart failure)   . Myocardial infarction    Past Surgical History  Procedure Laterality Date  . Angioplasty / stenting femoral    . Cardiac defibrillator placement      ICD-St Jude   History reviewed. No pertinent family history. History   Social History  . Marital Status: Married    Spouse Name: N/A    Number of Children: N/A  . Years of Education: N/A   Occupational History  . Not on file.   Social History Main Topics  . Smoking status: Former Smoker    Types: Cigarettes    Quit date: 03/21/1990  . Smokeless tobacco: Not on file  . Alcohol Use: No  . Drug Use: No  . Sexual Activity: Not on file   Other Topics Concern  . Not on file   Social History Narrative  . No narrative on file   Review of Systems: Pertinent items are noted in HPI. a comprehensive ROS was performed.   Physical Exam: Blood pressure 140/82, pulse 77, temperature 98.4 F (36.9 C), temperature source Oral, height 5'  9" (1.753 m), weight 201 lb 8 oz (91.4 kg), SpO2 99.00%. General: resting in bed, NAD on [place] HEENT: PERRL, EOMI, no scleral icterus, JVD nl Cardiac: RRR, no rubs, murmurs or gallops Pulm: expiratory and inspiratory wheezes, moving normal volumes of air Abd: soft, nontender, nondistended, BS present Ext: warm and well perfused, no pedal edema Neuro: alert and oriented X3, cranial nerves II-XII grossly intact  Lab results: pending at time of consult  Cardiac Studies: 01/23/2000 LHC: EF 45%, Moderate anterolateral hypokinesis with mild reduction in global left ventricular function. No evidence of restenosis at the previously placed stent sites. Moderate stenosis of the  posterolateral branch without worsening.   Imaging results:  No results found.   Assessment & Plan by Problem: #Flu-like symptoms: would recommend flu pcr, CXR, and management per primary team  #elevated troponins: less likely cardiac in nature, no new EKG changes, pt doesn't appear fluid overloaded on exam. VSS. ICD no recent events. This maybe in the setting of acute URI illness.  -trend trops  Signed: Christen [name], [name] 03/21/2013, 10:27 AM    Patient seen and examined with Dr. Burtis [name]. We discussed all aspects of the encounter. I agree with the assessment and plan as stated above.   79 y/o male with complicated medical history now presents with flu-like symptoms and mildly positive troponin (labs pending). Denies any CP or HF symptoms. ECG stable. Doubt active cardiac issue currently. If troponin is in fact mildly elevated may be due to chronic HF or mild demand ischemia from his URI. Primary team to manage. We will sign off. Please call with questions.   [name] Bensimhon,MD 11:01 AM

## 2013-03-21 NOTE — H&P (Signed)
PATIENT DETAILS Name: [name] Age: 79 y.o. Sex: male Date of Birth: 09/01/1934 Admit Date: 03/21/2013 GOT:LXBW,IOMBT, MD   CHIEF COMPLAINT:  Exertional dyspnea for 2 days Cough, fever for 2 days  HPI: [name] is a 79 y.o. male with a Past Medical History of chronic systolic heart failure, ischemic cardiomyopathy, V. tach status post ICD in 2005, hypertension, dyslipidemia who presents today with the above noted complaint. Per patient, for the past 2-3 days he has noted worsening of his exertional dyspnea. He claims that even walking approximately 10-15 yards makes him short of breath. This is associated with subjective fever and chills. He claims he has had a dry cough and nasal congestion for the past 2 days. He also gives a history of generalized myalgias. Cause of these symptoms, he presented to [facility] emergency room, and subsequently was transferred to [facility] after his troponins were positive. During my evaluation, patient appears very comfortable, without any chest pain. Patient gives no history of nausea, vomiting or diarrhea. He denies any lower extremity edema  Please note-while at [facility] was elevated, he CT scan of the chest was negative for pulmonary embolism. Influenza PCR was also reportedly negative.  ALLERGIES:  No Known Allergies  PAST MEDICAL HISTORY: Past Medical History  Diagnosis Date  . HTN (hypertension)   . Cardiomyopathy, ischemic   . Presence of permanent cardiac pacemaker   . CHF (congestive heart failure)   . Myocardial infarction     PAST SURGICAL HISTORY: Past Surgical History  Procedure Laterality Date  . Angioplasty / stenting femoral    . Cardiac defibrillator placement      ICD-St Jude    MEDICATIONS AT HOME: Prior to Admission medications   Medication Sig Start Date End Date Taking? Authorizing Provider  aspirin 81 MG tablet Take 162 mg by mouth at bedtime.    Yes Historical  Provider, MD  carvedilol (COREG) 3.125 MG tablet Take 3.125 mg by mouth 2 (two) times daily with a meal.     Yes Historical Provider, MD  lisinopril (PRINIVIL,ZESTRIL) 20 MG tablet Take 40 mg by mouth daily.    Yes Historical Provider, MD  niacin (NIASPAN) 1000 MG CR tablet Take 500 mg by mouth at bedtime.    Yes Historical Provider, MD  nitroGLYCERIN (NITROSTAT) 0.4 MG SL tablet Place 0.4 mg under the tongue every 5 (five) minutes as needed.     Yes Historical Provider, MD  omeprazole (PRILOSEC) 20 MG capsule Take 20 mg by mouth daily.   Yes Historical Provider, MD  pravastatin (PRAVACHOL) 20 MG tablet Take 20 mg by mouth daily. PCP will be changing to Lipitor in 5 weeks.   Yes Historical Provider, MD    FAMILY HISTORY: Denies any family history of coronary artery disease  SOCIAL HISTORY:  reports that he quit smoking about 23 years ago. His smoking use included Cigarettes. He smoked 0.00 packs per day. He does not have any smokeless tobacco history on file. He reports that he does not drink alcohol or use illicit drugs.  REVIEW OF SYSTEMS:  Constitutional:   No  weight loss, night sweats,   fatigue.  HEENT:    No headaches, Difficulty swallowing,Tooth/dental problems,Sore throat,  No sneezing, itching, ear ache, nasal congestion, post nasal drip,   Cardio-vascular: No chest pain,  Orthopnea, PND, swelling in lower extremities, anasarca, dizziness, palpitations  GI:  No heartburn, indigestion, abdominal pain, nausea, vomiting, diarrhea, change in  bowel habits, loss of appetite  Resp:  No coughing up of blood.No change in color of mucus.No chest wall deformity  Skin:  no rash or lesions.  GU:  no dysuria, change in color of urine, no urgency or frequency.  No flank pain.  Musculoskeletal: No joint pain or swelling.  No decreased range of motion.  No back pain.  Psych: No change in mood or affect. No depression or anxiety.  No memory loss.   PHYSICAL EXAM: Blood pressure  156/78, pulse 84, temperature 99.8 F (37.7 C), temperature source Oral, resp. rate 22, height 5\' 9"  (1.753 m), weight 91.4 kg (201 lb 8 oz), SpO2 92.00%.  General appearance :Awake, alert, not in any distress. Speech Clear. Not toxic Looking HEENT: Atraumatic and Normocephalic, pupils equally reactive to light and accomodation Neck: supple, no JVD. No cervical lymphadenopathy.  Chest:Good air entry bilaterally, prolonged expiration with rhonchi CVS: S1 S2 regular, no murmurs.  Abdomen: Bowel sounds present, Non tender and not distended with no gaurding, rigidity or rebound. Extremities: B/L Lower Ext shows no edema, both legs are warm to touch Neurology: Awake alert, and oriented X 3, CN II-XII intact, Non focal Skin:No Rash Wounds:N/A  LABS ON ADMISSION:   Recent Labs  03/21/13 1130  NA 136*  K 3.9  CL 95*  CO2 28  GLUCOSE 125*  BUN 14  CREATININE 1.24  CALCIUM 9.0    Recent Labs  03/21/13 1130  AST 19  ALT 15  ALKPHOS 131*  BILITOT 0.4  PROT 7.8  ALBUMIN 3.7   No results found for this basename: LIPASE, AMYLASE,  in the last 72 hours No results found for this basename: WBC, NEUTROABS, HGB, HCT, MCV, PLT,  in the last 72 hours  Recent Labs  03/21/13 1130  TROPONINI <0.30   No results found for this basename: DDIMER,  in the last 72 hours No components found with this basename: POCBNP,    RADIOLOGIC STUDIES ON ADMISSION: No results found.   EKG: Independently reviewed RBBB   ASSESSMENT AND PLAN: Present on Admission:  . Acute systolic heart failure - Suspect mild CHF decompensation  - Check x-rays, BNP  - Cautiously continue with Lasix-but claims that he has improved significantly [facility]  - Repeat echo  - Strict intake/output, daily weights  . Acute bronchitis - Likely secondary to viral syndrome-per paper chart-[facility]fluenza [facility]  - Will send a viral panel  - Will treat  with Solu-Medrol, nebulized bronchodilators and empiric antibiotics  . Elevated troponin level - Patient cardiology consultation -? Demand ischemia - Continue aspirin, statins and beta blockers - Cycled troponins  . HYPERTENSION - Continue home medications follow blood pressure   . history of ventricular tachycardia-status post AICD  Further plan will depend as patient's clinical course evolves and further radiologic and laboratory data become available. Patient will be monitored closely.  Above noted plan was discussed with patient, he was in agreement.   DVT Prophylaxis: Prophylactic Lovenox   Code Status: Full Code  Total time spent for admission equals 45 minutes.  [facility] Mem HsptlGHIMIRE,Maverick Dieudonne [facility] Pager [phone]  If 7PM-7AM, please contact night-coverage [email or web address] Password [email or web address] 03/21/2013, 12:40 PM

## 2013-03-22 LAB — BASIC METABOLIC PANEL
BUN: 22 mg/dL (ref 6–23)
CALCIUM: 9.2 mg/dL (ref 8.4–10.5)
CHLORIDE: 91 meq/L — AB (ref 96–112)
CO2: 27 meq/L (ref 19–32)
Creatinine, Ser: 1.38 mg/dL — ABNORMAL HIGH (ref 0.50–1.35)
GFR calc Af Amer: 56 mL/min — ABNORMAL LOW (ref >90)
GFR calc non Af Amer: 48 mL/min — ABNORMAL LOW (ref >90)
Glucose, Bld: 210 mg/dL — ABNORMAL HIGH (ref 70–99)
POTASSIUM: 4.2 meq/L (ref 3.7–5.3)
SODIUM: 132 meq/L — AB (ref 137–147)

## 2013-03-22 LAB — CBC
HCT: 43.9 % (ref 39.0–52.0)
Hemoglobin: 14.9 g/dL (ref 13.0–17.0)
MCH: 31.3 pg (ref 26.0–34.0)
MCHC: 33.9 g/dL (ref 30.0–36.0)
MCV: 92.2 fL (ref 78.0–100.0)
Platelets: 159 10*3/uL (ref 150–400)
RBC: 4.76 MIL/uL (ref 4.22–5.81)
RDW: 13.1 % (ref 11.5–15.5)
WBC: 8.1 10*3/uL (ref 4.0–10.5)

## 2013-03-22 LAB — GLUCOSE, CAPILLARY: Glucose-Capillary: 221 mg/dL — ABNORMAL HIGH (ref 70–99)

## 2013-03-22 MED ORDER — IPRATROPIUM-ALBUTEROL 0.5-2.5 (3) MG/3ML IN SOLN
3.0000 mL | Freq: Two times a day (BID) | RESPIRATORY_TRACT | Status: DC
  Administered 2013-03-23: 3 mL via RESPIRATORY_TRACT
  Filled 2013-03-22: qty 3

## 2013-03-22 MED ORDER — FUROSEMIDE 20 MG PO TABS
20.0000 mg | ORAL_TABLET | Freq: Every day | ORAL | Status: DC
  Administered 2013-03-23: 20 mg via ORAL
  Filled 2013-03-22: qty 1

## 2013-03-22 MED ORDER — LEVOFLOXACIN IN D5W 750 MG/150ML IV SOLN
750.0000 mg | INTRAVENOUS | Status: DC
  Filled 2013-03-22: qty 150

## 2013-03-22 MED ORDER — PREDNISONE 20 MG PO TABS
40.0000 mg | ORAL_TABLET | Freq: Every day | ORAL | Status: DC
  Administered 2013-03-22 – 2013-03-23 (×2): 40 mg via ORAL
  Filled 2013-03-22 (×6): qty 2

## 2013-03-22 MED ORDER — FUROSEMIDE 10 MG/ML IJ SOLN
20.0000 mg | Freq: Every day | INTRAMUSCULAR | Status: DC
  Administered 2013-03-22: 20 mg via INTRAVENOUS

## 2013-03-22 NOTE — Progress Notes (Signed)
Received to unit for 2H unit.  Client has a productive cough that is thick and brownish.  IV site unremarkable.  Alert and oriented.  Placed on monitor, call bell and remote within reach.  Introduce self to client.

## 2013-03-22 NOTE — Progress Notes (Signed)
PATIENT DETAILS Name: [name] RhodesLarry W [name] Age: 79 y.o. Sex: male Date of Birth: 06/07/1934 Admit Date: 03/21/2013 Admitting Physician Ron ParkerHarvette C Jenkins, MD ZOX:WRUE,AVWUJPCP:REID,INDIA, MD  Subjective: Feels so much better today-no major complaints.  Assessment/Plan: Acute systolic heart failure  - Suspect mild CHF decompensation- secondary to bronchitis/URI - Significantly better, and now clinically compensated - Decrease Lasix to 20 mg - Taper off oxygen, and ambulate   Acute bronchitis -Likely secondary to viral syndrome-per paper chart-[facility]flu[facility]. Respiratory virus panel pending. - Change Solu-Medrol to prednisone, continue nebulized bronchodilators, empiric Levaquin. - Taper off oxygen and ambulate.  Elevated troponin level  - cardiology consultation obtained, no further workup needed.  -? Demand ischemia  - Continue aspirin, statins and beta blockers  - Troponins were cycled, negative.  HYPERTENSION - Controlled with lisinopril and Coreg  History of coronary artery disease - Continued aspirin, Coreg and statins.  Disposition: Remain inpatient for now-ambulate-if does well can be discharged  DVT Prophylaxis: Prophylactic Lovenox   Code Status: Full code   Family Communication None at bedside  Procedures:  none  CONSULTS:  cardiology  Time spent 40 minutes-which includes 50% of the time with face-to-face with patient/ family and coordinating care related to the above assessment and plan.    MEDICATIONS: Scheduled Meds: . aspirin  162 mg Oral QHS  . carvedilol  3.125 mg Oral BID WC  . enoxaparin (LOVENOX) injection  40 mg Subcutaneous Q24H  . furosemide  20 mg Intravenous Daily  . ipratropium-albuterol  3 mL Nebulization Q6H  . levofloxacin (LEVAQUIN) IV  500 mg Intravenous Q24H  . lisinopril  40 mg Oral Daily  . methylPREDNISolone (SOLU-MEDROL) injection  40 mg Intravenous Q12H  . niacin  500 mg Oral QHS  . pantoprazole   40 mg Oral Daily  . simvastatin  10 mg Oral q1800  . sodium chloride  3 mL Intravenous Q12H  . sodium chloride  3 mL Intravenous Q12H   Continuous Infusions:  PRN Meds:.sodium chloride, acetaminophen, acetaminophen, albuterol, alum & mag hydroxide-simeth, guaiFENesin-dextromethorphan, nitroGLYCERIN, ondansetron (ZOFRAN) IV, ondansetron, sodium chloride  Antibiotics: Anti-infectives   Start     Dose/Rate Route Frequency Ordered Stop   03/21/13 1330  levofloxacin (LEVAQUIN) IVPB 500 mg     500 mg 100 mL/hr over 60 Minutes Intravenous Every 24 hours 03/21/13 1243         PHYSICAL EXAM: Vital signs in last 24 hours: Filed Vitals:   03/22/13 0602 03/22/13 0700 03/22/13 0745 03/22/13 0800  BP: 93/43 121/87  143/69  Pulse: 63 66  78  Temp:    98.2 F (36.8 C)  TempSrc:    Oral  Resp: 20 17  21   Height:      Weight:      SpO2: 92% 91% 90%     Weight change:  Filed Weights   03/21/13 0730 03/21/13 0737 03/22/13 0500  Weight: 91.4 kg (201 lb 8 oz) 91.4 kg (201 lb 8 oz) 84.1 kg (185 lb 6.5 oz)   Body mass index is 27.37 kg/(m^2).   Gen Exam: Awake and alert with clear speech.   Neck: Supple, No JVD.   Chest: B/L Clear.  No rhonchi heard at all. CVS: S1 S2 Regular, no murmurs.  Abdomen: soft, BS +, non tender, non distended.  Extremities: no edema, lower extremities warm to touch. Neurologic: Non Focal.   Skin: No Rash.   Wounds: N/A.   Intake/Output from previous  day:  Intake/Output Summary (Last 24 hours) at 03/22/13 0937 Last data filed at 03/22/13 0100  Gross per 24 hour  Intake    580 ml  Output   1825 ml  Net  -1245 ml     LAB RESULTS: CBC  Recent Labs Lab 03/21/13 1555 03/22/13 0245  WBC 9.4 8.1  HGB 15.4 14.9  HCT 45.0 43.9  PLT 140* 159  MCV 93.2 92.2  MCH 31.9 31.3  MCHC 34.2 33.9  RDW 13.4 13.1    Chemistries   Recent Labs Lab 03/21/13 1130 03/21/13 1555 03/22/13 0245  NA 136*  --  132*  K 3.9  --  4.2  CL 95*  --  91*  CO2 28   --  27  GLUCOSE 125*  --  210*  BUN 14  --  22  CREATININE 1.24 1.46* 1.38*  CALCIUM 9.0  --  9.2    CBG:  Recent Labs Lab 03/22/13 0738  GLUCAP 221*    GFR Estimated Creatinine Clearance: 45.5 ml/min (by C-G formula based on Cr of 1.38).  Coagulation profile No results found for this basename: INR, PROTIME,  in the last 168 hours  Cardiac Enzymes  Recent Labs Lab 03/21/13 1130 03/21/13 1555 03/21/13 2230  TROPONINI <0.30 <0.30 <0.30    No components found with this basename: POCBNP,  No results found for this basename: DDIMER,  in the last 72 hours No results found for this basename: HGBA1C,  in the last 72 hours No results found for this basename: CHOL, HDL, LDLCALC, TRIG, CHOLHDL, LDLDIRECT,  in the last 72 hours No results found for this basename: TSH, T4TOTAL, FREET3, T3FREE, THYROIDAB,  in the last 72 hours No results found for this basename: VITAMINB12, FOLATE, FERRITIN, TIBC, IRON, RETICCTPCT,  in the last 72 hours No results found for this basename: LIPASE, AMYLASE,  in the last 72 hours  Urine Studies No results found for this basename: UACOL, UAPR, USPG, UPH, UTP, UGL, UKET, UBIL, UHGB, UNIT, UROB, ULEU, UEPI, UWBC, URBC, UBAC, CAST, CRYS, UCOM, BILUA,  in the last 72 hours  MICROBIOLOGY: Recent Results (from the past 240 hour(s))  MRSA PCR SCREENING     Status: None   Collection Time    03/21/13  8:13 AM      Result Value Ref Range Status   MRSA by PCR NEGATIVE  NEGATIVE Final   Comment:            The GeneXpert MRSA Assay (FDA     approved for NASAL specimens     only), is one component of a     comprehensive MRSA colonization     surveillance program. It is not     intended to diagnose MRSA     infection nor to guide or     monitor treatment for     MRSA infections.    RADIOLOGY STUDIES/RESULTS: Dg Chest 2 View  03/21/2013   CLINICAL DATA:  CHF, hypertension, history of ischemic cardiomyopathy, now with shortness of breath  EXAM: CHEST  2  VIEW  COMPARISON:  DG CHEST 2 VIEW dated 10/06/2003; DG CHEST 2 VIEW dated 09/29/2003  FINDINGS: Grossly unchanged cardiac silhouette and mediastinal contours. Stable positioning of support apparatus. The lungs are hyperexpanded with flattening of the bilateral may diaphragms of mild diffuse slightly nodular thickening of the pulmonary interstitium. No focal airspace opacities. No pleural effusion or pneumothorax. No definite evidence of edema. No acute osseus abnormalities.  IMPRESSION: Hyperexpanded lungs and bronchitic  change without acute cardiopulmonary disease.   Electronically Signed   By: Simonne Come M.D.   On: 03/21/2013 17:54    Jeoffrey Massed, MD  [facility] Pager:[phone]  If 7PM-7AM, please contact night-coverage [email or web address] Password [facility] 03/22/2013, 9:37 AM   LOS: 1 day

## 2013-03-22 NOTE — Progress Notes (Signed)
Utilization Review Completed.[name] T2/15/2015  

## 2013-03-23 ENCOUNTER — Encounter (HOSPITAL_COMMUNITY): Payer: Self-pay | Admitting: Cardiology

## 2013-03-23 DIAGNOSIS — I513 Intracardiac thrombosis, not elsewhere classified: Secondary | ICD-10-CM

## 2013-03-23 DIAGNOSIS — I519 Heart disease, unspecified: Secondary | ICD-10-CM

## 2013-03-23 DIAGNOSIS — R0602 Shortness of breath: Secondary | ICD-10-CM

## 2013-03-23 HISTORY — DX: Intracardiac thrombosis, not elsewhere classified: I51.3

## 2013-03-23 LAB — RESPIRATORY VIRUS PANEL
Adenovirus: NOT DETECTED
INFLUENZA A H1: NOT DETECTED
INFLUENZA A H3: DETECTED — AB
INFLUENZA A: DETECTED — AB
Influenza B: NOT DETECTED
Metapneumovirus: NOT DETECTED
PARAINFLUENZA 2 A: NOT DETECTED
Parainfluenza 1: NOT DETECTED
Parainfluenza 3: NOT DETECTED
RESPIRATORY SYNCYTIAL VIRUS A: NOT DETECTED
RESPIRATORY SYNCYTIAL VIRUS B: NOT DETECTED
RHINOVIRUS: NOT DETECTED

## 2013-03-23 LAB — CBC
HCT: 43.7 % (ref 39.0–52.0)
Hemoglobin: 15.1 g/dL (ref 13.0–17.0)
MCH: 31.8 pg (ref 26.0–34.0)
MCHC: 34.6 g/dL (ref 30.0–36.0)
MCV: 92 fL (ref 78.0–100.0)
PLATELETS: 179 10*3/uL (ref 150–400)
RBC: 4.75 MIL/uL (ref 4.22–5.81)
RDW: 13.3 % (ref 11.5–15.5)
WBC: 15.8 10*3/uL — ABNORMAL HIGH (ref 4.0–10.5)

## 2013-03-23 LAB — GLUCOSE, CAPILLARY: Glucose-Capillary: 200 mg/dL — ABNORMAL HIGH (ref 70–99)

## 2013-03-23 MED ORDER — FUROSEMIDE 10 MG/ML IJ SOLN
20.0000 mg | Freq: Once | INTRAMUSCULAR | Status: AC
  Administered 2013-03-23: 20 mg via INTRAVENOUS
  Filled 2013-03-23: qty 2

## 2013-03-23 MED ORDER — IPRATROPIUM-ALBUTEROL 0.5-2.5 (3) MG/3ML IN SOLN
3.0000 mL | Freq: Three times a day (TID) | RESPIRATORY_TRACT | Status: DC
  Administered 2013-03-23 (×2): 3 mL via RESPIRATORY_TRACT
  Filled 2013-03-23 (×2): qty 3

## 2013-03-23 MED ORDER — FUROSEMIDE 40 MG PO TABS
40.0000 mg | ORAL_TABLET | Freq: Every day | ORAL | Status: DC
  Filled 2013-03-23: qty 1

## 2013-03-23 MED ORDER — GUAIFENESIN ER 600 MG PO TB12
600.0000 mg | ORAL_TABLET | Freq: Two times a day (BID) | ORAL | Status: DC
  Administered 2013-03-23 – 2013-03-25 (×4): 600 mg via ORAL
  Filled 2013-03-23 (×6): qty 1

## 2013-03-23 MED ORDER — POTASSIUM CHLORIDE CRYS ER 20 MEQ PO TBCR
20.0000 meq | EXTENDED_RELEASE_TABLET | Freq: Every day | ORAL | Status: DC
  Administered 2013-03-23 – 2013-03-25 (×2): 20 meq via ORAL
  Filled 2013-03-23 (×3): qty 1

## 2013-03-23 MED ORDER — HEPARIN (PORCINE) IN NACL 100-0.45 UNIT/ML-% IJ SOLN
1300.0000 [IU]/h | INTRAMUSCULAR | Status: DC
  Administered 2013-03-23: 1300 [IU]/h via INTRAVENOUS
  Filled 2013-03-23 (×3): qty 250

## 2013-03-23 MED ORDER — ASPIRIN 81 MG PO CHEW
162.0000 mg | CHEWABLE_TABLET | Freq: Every day | ORAL | Status: DC
  Administered 2013-03-23 – 2013-03-24 (×2): 162 mg via ORAL
  Filled 2013-03-23 (×2): qty 2

## 2013-03-23 MED ORDER — PERFLUTREN LIPID MICROSPHERE
1.0000 mL | INTRAVENOUS | Status: AC | PRN
  Administered 2013-03-23: 3 mL via INTRAVENOUS
  Filled 2013-03-23: qty 10

## 2013-03-23 NOTE — Progress Notes (Signed)
Asked to see back in consult secondary to 2 D Echo with suspicion for an apical thrombus.  Though the left ventricle and endocardium were poorly visualized. Regional wall motion could not be assessed.  79 y/o male with pmh of ischemic cardiomyopathy, VT s/p ICD 2005, CAD/MI 1992 with PTCA of LAD complicated by reocclusion and recanalization, previous RCA stending LHC 01/2000 by Dr. Riley KillStuckey EF 45%, 30-40% LAD stenosis, LCX normal, patent RCA 3.0 x 18 mm [address] stent, 50-70% stenosis distal RCA, 40% PLA, last cardiolite study 01/2000 extensive anterior/apical scar, no ischemia, EF 27%, chronic systolic HF on medical therapy, HTN followed by Dr. Ladona Ridgelaylor who presented at outside hospital with some ongoing congestion, productive cough, and elevated troponin 0.08.  He presented with flu-like symptoms and mildly positive troponin (labs pending) [facility]. Denies any CP or HF symptoms. ECG stable. Doubt active cardiac issue currently. Troponin here was negative.  His mild bump may have been due to chronic HF or mild demand ischemia from his URI.   Subjective: No chest pain, no active SOB, did have productive cough thick green sputum today.  Objective: Vital signs in last 24 hours: Temp:  [97.5 F (36.4 C)-98 F (36.7 C)] 97.5 F (36.4 C) (02/16 0727) Pulse Rate:  [66-95] 82 (02/16 1413) Resp:  [20] 20 (02/16 1413) BP: (90-118)/(52-69) 90/57 mmHg (02/16 1413) SpO2:  [92 %-97 %] 95 % (02/16 1413) Weight:  [183 lb 6.8 oz (83.2 kg)-184 lb 12.8 oz (83.825 kg)] 183 lb 6.8 oz (83.2 kg) (02/16 0730) Weight change: -16 lb 11.2 oz (-7.575 kg) Last BM Date: 03/22/13 Intake/Output from previous day: 02/15 0701 - 02/16 0700 In: 570 [P.O.:570] Out: 1025 [Urine:1025] Intake/Output this shift: Total I/O In: 600 [P.O.:600] Out: 425 [Urine:425]  PE: General:Pleasant affect, NAD Skin:Warm and dry, brisk capillary refill HEENT:normocephalic, sclera clear, mucus membranes moist Neck:supple, no  JVD, no bruits  Heart:S1S2 RRR without murmur, gallup, rub or click Lungs: without rales,occ. rhonchi, no wheezes ZOX:WRUEAbd:soft, non tender, + BS, do not palpate liver spleen or masses Ext:no lower ext edema, 2+ pedal pulses, 2+ radial pulses Neuro:alert and oriented, MAE, follows commands, + facial symmetry   Lab Results:  Recent Labs  03/22/13 0245 03/23/13 1310  WBC 8.1 15.8*  HGB 14.9 15.1  HCT 43.9 43.7  PLT 159 179   BMET  Recent Labs  03/21/13 1130 03/21/13 1555 03/22/13 0245  NA 136*  --  132*  K 3.9  --  4.2  CL 95*  --  91*  CO2 28  --  27  GLUCOSE 125*  --  210*  BUN 14  --  22  CREATININE 1.24 1.46* 1.38*  CALCIUM 9.0  --  9.2    Recent Labs  03/21/13 1555 03/21/13 2230  TROPONINI <0.30 <0.30    No results found for this basename: CHOL,  HDL,  LDLCALC,  LDLDIRECT,  TRIG,  CHOLHDL   No results found for this basename: HGBA1C     No results found for this basename: TSH    Hepatic Function Panel  Recent Labs  03/21/13 1130  PROT 7.8  ALBUMIN 3.7  AST 19  ALT 15  ALKPHOS 131*  BILITOT 0.4   No results found for this basename: CHOL,  in the last 72 hours No results found for this basename: PROTIME,  in the last 72 hours     Studies/Results: Dg Chest 2 View  03/21/2013   CLINICAL DATA:  CHF, hypertension,  history of ischemic cardiomyopathy, now with shortness of breath  EXAM: CHEST  2 VIEW  COMPARISON:  DG CHEST 2 VIEW dated 10/06/2003; DG CHEST 2 VIEW dated 09/29/2003  FINDINGS: Grossly unchanged cardiac silhouette and mediastinal contours. Stable positioning of support apparatus. The lungs are hyperexpanded with flattening of the bilateral may diaphragms of mild diffuse slightly nodular thickening of the pulmonary interstitium. No focal airspace opacities. No pleural effusion or pneumothorax. No definite evidence of edema. No acute osseus abnormalities.  IMPRESSION: Hyperexpanded lungs and bronchitic change without acute cardiopulmonary disease.    Electronically Signed   By: Simonne Come M.D.   On: 03/21/2013 17:54   2D Echo: Left ventricle: Left ventricle and endocardium are poorly visualized. Regional wall motion cannot be assessed. There is at least moderate systolic LV dysfunction and a high suspicion for an apical thrombus. A further work-up with either TEE or cardiac MRI is recommended. The cavity size was moderately dilated. Doppler parameters are consistent with abnormal left ventricular relaxation (grade 1 diastolic dysfunction). Doppler parameters are consistent with elevated ventricular end-diastolic filling pressure. - Aortic valve: No regurgitation. - Mitral valve: No regurgitation. - Right ventricle: Systolic function was normal. - Right atrium: The atrium was normal in size. - Pulmonary arteries: Systolic pressure was within the normal range. - Inferior vena cava: The vessel was normal in size. - Pericardium, extracardiac: There was no pericardial effusion.    Medications: I have reviewed the patient's current medications. Scheduled Meds: . aspirin  162 mg Oral QHS  . carvedilol  3.125 mg Oral BID WC  . [START ON 03/24/2013] furosemide  40 mg Oral Daily  . guaiFENesin  600 mg Oral BID  . ipratropium-albuterol  3 mL Nebulization TID  . [START ON 03/24/2013] levofloxacin (LEVAQUIN) IV  750 mg Intravenous Q48H  . lisinopril  40 mg Oral Daily  . niacin  500 mg Oral QHS  . pantoprazole  40 mg Oral Daily  . potassium chloride  20 mEq Oral Daily  . predniSONE  40 mg Oral Q breakfast  . simvastatin  10 mg Oral q1800  . sodium chloride  3 mL Intravenous Q12H  . sodium chloride  3 mL Intravenous Q12H   Continuous Infusions: . heparin     PRN Meds:.sodium chloride, acetaminophen, acetaminophen, albuterol, alum & mag hydroxide-simeth, guaiFENesin-dextromethorphan, nitroGLYCERIN, ondansetron (ZOFRAN) IV, ondansetron, sodium chloride  Assessment/Plan: Principal Problem:   Elevated troponin at [facility] but  normal at [facility] Active Problems:   HYPERTENSION   Coronary atherosclerosis of native coronary artery   Chronic systolic heart failure   CHF exacerbation   Acute bronchitis   Apical mural thrombus  PLAN: heparin is being started.  Would plan for TEE.  IF positive would need anticoagulation.  No history of falls or GI bleed.    MD to eval.  LOS: 2 days   Time spent with pt. : 20 minutes. [facility] R  Nurse Practitioner Certified Pager [phone] or after 5pm and on weekends call [phone] 03/23/2013, 4:01 PM  Patient seen with NP, agree with the above note.    Patient has a questionable apical thrombus with very difficult echo windows, even with Definity.  He will need anticoagulation with warfarin if thrombus is there.  Start heparin gtt now.   He cannot have an MRI with ICD.  We need to confirm clot before committing him to coumadin.  I think he needs a TEE to confirm.  He tells me that he has had significant problem recently with  food getting stuck in his throat and needing to regurgitate.  He has had what sounds like esophageal dilatation before.  I think that he needs an EGD with possible dilatation prior to attempted a TEE.  Will involve GI.   Marca Ancona 03/23/2013 4:41 PM

## 2013-03-23 NOTE — Progress Notes (Signed)
Report given to receiving RN. Patient in bed resting watching TV. No verbal complaints and no signs or symptoms of distress or discomfort. 

## 2013-03-23 NOTE — Progress Notes (Signed)
BP is 98/61 and HR is 74. Pt is due for coreg. No signs of distress or discomfort. No verbal complaints. MD notified. Ok to hold coreg. Will continue to monitor patient for further changes in condition.

## 2013-03-23 NOTE — Progress Notes (Signed)
VASCULAR LAB PRELIMINARY  PRELIMINARY  PRELIMINARY  PRELIMINARY  Bilateral lower extremity venous duplex  completed.    Preliminary report:  Bilateral:  No evidence of DVT, superficial thrombosis, or Baker's Cyst.  Incidental finding:  Right popliteal artery aneurysm.  There is a 3mm flow channel.  Measurement at prox pop a 10mm.  Mid pop 2.71cm and dist pop 26mm.    Hayly Litsey, RVT 03/23/2013, 12:16 PM

## 2013-03-23 NOTE — Progress Notes (Signed)
Echocardiogram 2D Echocardiogram with Definity has been performed.  [name] 03/23/2013, 12:58 PM

## 2013-03-23 NOTE — Progress Notes (Signed)
PATIENT DETAILS Name: [name] RhodesLarry W [name] Age: 79 y.o. Sex: male Date of Birth: 09/19/1934 Admit Date: 03/21/2013 Admitting Physician Ron ParkerHarvette C Jenkins, MD NWG:NFAO,ZHYQMPCP:REID,INDIA, MD  Subjective: Continues to improve.  Assessment/Plan: Acute respiratory failure - Multifactorial, secondary to acute bronchitis, mild systolic heart failure. - Continue to treat with Lasix, prednisone, nebulized bronchodilators and antibiotic therapy. - As noted in H&P, CT [facility] was [facility]. Await lower extremity Dopplers and echocardiogram  Acute systolic heart failure  - Suspect mild CHF decompensation- secondary to bronchitis/URI - Significantly better, and now clinically compensated -c/w lasix, check echo - Taper off oxygen, and ambulate   Acute bronchitis -Likely secondary to viral syndrome-per paper chart-influenza PCR [facility]. Respiratory virus panel pending. - Continue with prednisone, continue nebulized bronchodilators, empiric Levaquin. - Taper off oxygen and ambulate.  Elevated troponin level  - cardiology consultation obtained, no further workup needed.  -? Demand ischemia  - Continue aspirin, statins and beta blockers  - Troponins were cycled here, they have been negative. Suspect that the troponins [facility] was false-positive.  HYPERTENSION - Controlled with lisinopril and Coreg  History of coronary artery disease - Continued aspirin, Coreg and statins.  Disposition: Remain inpatient for now- encourage ambulation, home soon.  DVT Prophylaxis: Prophylactic Lovenox   Code Status: Full code   Family Communication None at bedside  Procedures:  none  CONSULTS:  cardiology  MEDICATIONS: Scheduled Meds: . aspirin  162 mg Oral QHS  . carvedilol  3.125 mg Oral BID WC  . enoxaparin (LOVENOX) injection  40 mg Subcutaneous Q24H  . furosemide  20 mg Oral Daily  . ipratropium-albuterol  3 mL  Nebulization TID  . [START ON 03/24/2013] levofloxacin (LEVAQUIN) IV  750 mg Intravenous Q48H  . lisinopril  40 mg Oral Daily  . niacin  500 mg Oral QHS  . pantoprazole  40 mg Oral Daily  . predniSONE  40 mg Oral Q breakfast  . simvastatin  10 mg Oral q1800  . sodium chloride  3 mL Intravenous Q12H  . sodium chloride  3 mL Intravenous Q12H   Continuous Infusions:  PRN Meds:.sodium chloride, acetaminophen, acetaminophen, albuterol, alum & mag hydroxide-simeth, guaiFENesin-dextromethorphan, nitroGLYCERIN, ondansetron (ZOFRAN) IV, ondansetron, sodium chloride  Antibiotics: Anti-infectives   Start     Dose/Rate Route Frequency Ordered Stop   03/24/13 1400  levofloxacin (LEVAQUIN) IVPB 750 mg     750 mg 100 mL/hr over 90 Minutes Intravenous Every 48 hours 03/22/13 1508     03/21/13 1330  levofloxacin (LEVAQUIN) IVPB 500 mg  Status:  Discontinued     500 mg 100 mL/hr over 60 Minutes Intravenous Every 24 hours 03/21/13 1243 03/22/13 1508       PHYSICAL EXAM: Vital signs in last 24 hours: Filed Vitals:   03/23/13 0727 03/23/13 0730 03/23/13 0847 03/23/13 1028  BP: 104/61   114/69  Pulse: 66   95  Temp: 97.5 F (36.4 C)     TempSrc: Oral     Resp: 20     Height:      Weight:  83.2 kg (183 lb 6.8 oz)    SpO2: 92%  92%     Weight change: -7.575 kg (-16 lb 11.2 oz) Filed Weights   03/22/13 0500 03/22/13 1700 03/23/13 0730  Weight: 84.1 kg (185 lb 6.5 oz) 83.825 kg (184 lb 12.8 oz) 83.2 kg (183 lb 6.8 oz)   Body mass  index is 27.07 kg/(m^2).   Gen Exam: Awake and alert with clear speech.   Neck: Supple, No JVD.   Chest: B/L Clear.  No rhonchi heard at all. CVS: S1 S2 Regular, no murmurs.  Abdomen: soft, BS +, non tender, non distended.  Extremities: no edema, lower extremities warm to touch. Neurologic: Non Focal.   Skin: No Rash.   Wounds: N/A.   Intake/Output from previous day:  Intake/Output Summary (Last 24 hours) at 03/23/13 1200 Last data filed at 03/23/13 0865   Gross per 24 hour  Intake    450 ml  Output    175 ml  Net    275 ml     LAB RESULTS: CBC  Recent Labs Lab 03/21/13 1555 03/22/13 0245  WBC 9.4 8.1  HGB 15.4 14.9  HCT 45.0 43.9  PLT 140* 159  MCV 93.2 92.2  MCH 31.9 31.3  MCHC 34.2 33.9  RDW 13.4 13.1    Chemistries   Recent Labs Lab 03/21/13 1130 03/21/13 1555 03/22/13 0245  NA 136*  --  132*  K 3.9  --  4.2  CL 95*  --  91*  CO2 28  --  27  GLUCOSE 125*  --  210*  BUN 14  --  22  CREATININE 1.24 1.46* 1.38*  CALCIUM 9.0  --  9.2    CBG:  Recent Labs Lab 03/22/13 0738 03/23/13 0609  GLUCAP 221* 200*    GFR Estimated Creatinine Clearance: 45.5 ml/min (by C-G formula based on Cr of 1.38).  Coagulation profile No results found for this basename: INR, PROTIME,  in the last 168 hours  Cardiac Enzymes  Recent Labs Lab 03/21/13 1130 03/21/13 1555 03/21/13 2230  TROPONINI <0.30 <0.30 <0.30    No components found with this basename: POCBNP,  No results found for this basename: DDIMER,  in the last 72 hours No results found for this basename: HGBA1C,  in the last 72 hours No results found for this basename: CHOL, HDL, LDLCALC, TRIG, CHOLHDL, LDLDIRECT,  in the last 72 hours No results found for this basename: TSH, T4TOTAL, FREET3, T3FREE, THYROIDAB,  in the last 72 hours No results found for this basename: VITAMINB12, FOLATE, FERRITIN, TIBC, IRON, RETICCTPCT,  in the last 72 hours No results found for this basename: LIPASE, AMYLASE,  in the last 72 hours  Urine Studies No results found for this basename: UACOL, UAPR, USPG, UPH, UTP, UGL, UKET, UBIL, UHGB, UNIT, UROB, ULEU, UEPI, UWBC, URBC, UBAC, CAST, CRYS, UCOM, BILUA,  in the last 72 hours  MICROBIOLOGY: Recent Results (from the past 240 hour(s))  MRSA PCR SCREENING     Status: None   Collection Time    03/21/13  8:13 AM      Result Value Ref Range Status   MRSA by PCR NEGATIVE  NEGATIVE Final   Comment:            The GeneXpert MRSA  Assay (FDA     approved for NASAL specimens     only), is one component of a     comprehensive MRSA colonization     surveillance program. It is not     intended to diagnose MRSA     infection nor to guide or     monitor treatment for     MRSA infections.    RADIOLOGY STUDIES/RESULTS: Dg Chest 2 View  03/21/2013   CLINICAL DATA:  CHF, hypertension, history of ischemic cardiomyopathy, now with shortness of breath  EXAM:  CHEST  2 VIEW  COMPARISON:  DG CHEST 2 VIEW dated 10/06/2003; DG CHEST 2 VIEW dated 09/29/2003  FINDINGS: Grossly unchanged cardiac silhouette and mediastinal contours. Stable positioning of support apparatus. The lungs are hyperexpanded with flattening of the bilateral may diaphragms of mild diffuse slightly nodular thickening of the pulmonary interstitium. No focal airspace opacities. No pleural effusion or pneumothorax. No definite evidence of edema. No acute osseus abnormalities.  IMPRESSION: Hyperexpanded lungs and bronchitic change without acute cardiopulmonary disease.   Electronically Signed   By: Simonne Come M.D.   On: 03/21/2013 17:54    Jeoffrey Massed, MD  [facility] Pager:[phone]  If 7PM-7AM, please contact night-coverage [email or web address] Password [email or web address] 03/23/2013, 12:00 PM   LOS: 2 days

## 2013-03-23 NOTE — Progress Notes (Deleted)
Echocardiogram 2D Echocardiogram has been performed.  [name] 03/23/2013, 12:58 PM

## 2013-03-23 NOTE — Progress Notes (Signed)
ANTICOAGULATION CONSULT NOTE - Initial Consult  Pharmacy Consult for Heparin Indication: Apical thrombus  No Known Allergies  Patient Measurements: Height: 5\' 9"  (175.3 cm) Weight: 183 lb 6.8 oz (83.2 kg) IBW/kg (Calculated) : 70.7 Heparin Dosing Weight: 83 kg  Vital Signs: Temp: 97.5 F (36.4 C) (02/16 0727) Temp src: Oral (02/16 0727) BP: 90/57 mmHg (02/16 1413) Pulse Rate: 82 (02/16 1413)  Labs:  Recent Labs  03/21/13 1130  03/21/13 1555 03/21/13 2230 03/22/13 0245 03/23/13 1310  HGB  --   < > 15.4  --  14.9 15.1  HCT  --   --  45.0  --  43.9 43.7  PLT  --   --  140*  --  159 179  CREATININE 1.24  --  1.46*  --  1.38*  --   TROPONINI <0.30  --  <0.30 <0.30  --   --   < > = values in this interval not displayed.  Estimated Creatinine Clearance: 45.5 ml/min (by C-G formula based on Cr of 1.38).   Medical History: Past Medical History  Diagnosis Date  . HTN (hypertension)   . Cardiomyopathy, ischemic   . Presence of permanent cardiac pacemaker   . CHF (congestive heart failure)   . Myocardial infarction     Medications: Lovenox 40mg  (~0.5mg /kg for this patient) SQ daily- Last dose 13:13PM.   Assessment: 47 YOM with high suspicion for apical thrombus on ECHO (poorly visualized) to start IV heparin. SCr 1.38/ estimated CrCl ~66mL/min. H/H is within normal limits. Platelets are improving at 179. Dopplers are negative for DVT. No bleeding reported.   Goal of Therapy:  Heparin level 0.3-0.7 units/ml Monitor platelets by anticoagulation protocol: Yes   Plan:  1. Start heparin without a bolus due to recent Lovenox dose at 1300 units/hr.  2. Heparin level in 8 hours.  3. Daily heparin level and CBC.   Link Snuffer, PharmD, BCPS Clinical Pharmacist [phone] 03/23/2013,3:46 PM

## 2013-03-23 NOTE — Progress Notes (Signed)
SATURATION QUALIFICATIONS: (This note is used to comply with regulatory documentation for home oxygen)  Patient Saturations on Room Air at Rest = 88%      

## 2013-03-24 ENCOUNTER — Encounter (HOSPITAL_COMMUNITY)
Admission: AD | Disposition: A | Discharge status: Home / Self Care | Payer: Medicare Other | Source: Other Acute Inpatient Hospital | Attending: Internal Medicine

## 2013-03-24 ENCOUNTER — Encounter (HOSPITAL_COMMUNITY): Payer: Self-pay | Admitting: *Deleted

## 2013-03-24 ENCOUNTER — Inpatient Hospital Stay (HOSPITAL_COMMUNITY): Payer: Medicare Other

## 2013-03-24 DIAGNOSIS — I509 Heart failure, unspecified: Secondary | ICD-10-CM

## 2013-03-24 DIAGNOSIS — I2109 ST elevation (STEMI) myocardial infarction involving other coronary artery of anterior wall: Secondary | ICD-10-CM

## 2013-03-24 HISTORY — PX: TEE WITHOUT CARDIOVERSION: SHX5443

## 2013-03-24 LAB — BASIC METABOLIC PANEL
BUN: 57 mg/dL — ABNORMAL HIGH (ref 6–23)
CALCIUM: 8.6 mg/dL (ref 8.4–10.5)
CO2: 28 mEq/L (ref 19–32)
CREATININE: 2.17 mg/dL — AB (ref 0.50–1.35)
Chloride: 92 mEq/L — ABNORMAL LOW (ref 96–112)
GFR calc Af Amer: 32 mL/min — ABNORMAL LOW (ref >90)
GFR, EST NON AFRICAN AMERICAN: 28 mL/min — AB (ref >90)
GLUCOSE: 219 mg/dL — AB (ref 70–99)
Potassium: 3.9 mEq/L (ref 3.7–5.3)
SODIUM: 133 meq/L — AB (ref 137–147)

## 2013-03-24 LAB — CBC
HCT: 42.4 % (ref 39.0–52.0)
HEMOGLOBIN: 14.4 g/dL (ref 13.0–17.0)
MCH: 31.2 pg (ref 26.0–34.0)
MCHC: 34 g/dL (ref 30.0–36.0)
MCV: 91.8 fL (ref 78.0–100.0)
Platelets: 187 10*3/uL (ref 150–400)
RBC: 4.62 MIL/uL (ref 4.22–5.81)
RDW: 13.4 % (ref 11.5–15.5)
WBC: 16.1 10*3/uL — ABNORMAL HIGH (ref 4.0–10.5)

## 2013-03-24 LAB — PROTIME-INR
INR: 1.05 (ref 0.00–1.49)
Prothrombin Time: 13.5 seconds (ref 11.6–15.2)

## 2013-03-24 LAB — PRO B NATRIURETIC PEPTIDE: Pro B Natriuretic peptide (BNP): 473.5 pg/mL — ABNORMAL HIGH (ref 0–450)

## 2013-03-24 LAB — HEPARIN LEVEL (UNFRACTIONATED)
Heparin Unfractionated: 1.32 IU/mL — ABNORMAL HIGH (ref 0.30–0.70)
Heparin Unfractionated: 1.1 IU/mL — ABNORMAL HIGH (ref 0.30–0.70)
Heparin Unfractionated: 0.46 IU/mL (ref 0.30–0.70)

## 2013-03-24 LAB — GLUCOSE, CAPILLARY: Glucose-Capillary: 148 mg/dL — ABNORMAL HIGH (ref 70–99)

## 2013-03-24 SURGERY — ECHOCARDIOGRAM, TRANSESOPHAGEAL
Anesthesia: Moderate Sedation

## 2013-03-24 MED ORDER — FENTANYL CITRATE 0.05 MG/ML IJ SOLN
INTRAMUSCULAR | Status: DC | PRN
  Administered 2013-03-24 (×2): 25 ug via INTRAVENOUS

## 2013-03-24 MED ORDER — WARFARIN - PHARMACIST DOSING INPATIENT
Freq: Every day | Status: DC
  Administered 2013-03-24: 18:00:00

## 2013-03-24 MED ORDER — PREDNISONE 20 MG PO TABS
30.0000 mg | ORAL_TABLET | Freq: Every day | ORAL | Status: DC
  Administered 2013-03-25: 30 mg via ORAL
  Filled 2013-03-24 (×3): qty 1

## 2013-03-24 MED ORDER — COUMADIN BOOK
Freq: Once | Status: AC
  Administered 2013-03-24: 16:00:00
  Filled 2013-03-24: qty 1

## 2013-03-24 MED ORDER — BUTAMBEN-TETRACAINE-BENZOCAINE 2-2-14 % EX AERO
INHALATION_SPRAY | CUTANEOUS | Status: DC | PRN
  Administered 2013-03-24: 2 via TOPICAL

## 2013-03-24 MED ORDER — HEPARIN (PORCINE) IN NACL 100-0.45 UNIT/ML-% IJ SOLN
900.0000 [IU]/h | INTRAMUSCULAR | Status: DC
  Administered 2013-03-24: 1100 [IU]/h via INTRAVENOUS
  Administered 2013-03-24: 900 [IU]/h via INTRAVENOUS
  Filled 2013-03-24 (×3): qty 250

## 2013-03-24 MED ORDER — OSELTAMIVIR PHOSPHATE 75 MG PO CAPS
75.0000 mg | ORAL_CAPSULE | Freq: Two times a day (BID) | ORAL | Status: DC
  Administered 2013-03-24 (×2): 75 mg via ORAL
  Filled 2013-03-24 (×4): qty 1

## 2013-03-24 MED ORDER — MIDAZOLAM HCL 10 MG/2ML IJ SOLN
INTRAMUSCULAR | Status: DC | PRN
  Administered 2013-03-24: 1 mg via INTRAVENOUS
  Administered 2013-03-24: 2 mg via INTRAVENOUS
  Administered 2013-03-24: 1 mg via INTRAVENOUS

## 2013-03-24 MED ORDER — SODIUM CHLORIDE 0.9 % IV SOLN: INTRAVENOUS | Status: DC

## 2013-03-24 MED ORDER — WARFARIN SODIUM 7.5 MG PO TABS
7.5000 mg | ORAL_TABLET | Freq: Once | ORAL | Status: AC
  Administered 2013-03-24: 7.5 mg via ORAL
  Filled 2013-03-24: qty 1

## 2013-03-24 MED ORDER — MIDAZOLAM HCL 5 MG/ML IJ SOLN
INTRAMUSCULAR | Status: AC
  Filled 2013-03-24: qty 2

## 2013-03-24 MED ORDER — IPRATROPIUM-ALBUTEROL 0.5-2.5 (3) MG/3ML IN SOLN
3.0000 mL | Freq: Two times a day (BID) | RESPIRATORY_TRACT | Status: DC
  Administered 2013-03-24 – 2013-03-25 (×3): 3 mL via RESPIRATORY_TRACT
  Filled 2013-03-24 (×2): qty 3

## 2013-03-24 MED ORDER — FENTANYL CITRATE 0.05 MG/ML IJ SOLN
INTRAMUSCULAR | Status: AC
  Filled 2013-03-24: qty 2

## 2013-03-24 MED ORDER — WARFARIN VIDEO
Freq: Once | Status: AC
  Administered 2013-03-24: 16:00:00

## 2013-03-24 NOTE — Consult Note (Signed)
Subjective:   HPI  The patient is a 79 year old male who we are asked to see in regards to dysphagia. Patient states that he has had intermittent problems swallowing solid foods for the past 20 years when he eats too fast. If he takes this time eating he does fine. He had an echocardiogram done with a possible apical thrombus present and it was felt that he should have a TEE to further investigate, but because of his dysphagia we were asked to see him to make sure that this could be done safely. Also he has an apical thrombus he will need to be put on anticoagulation.  The patient went down for a barium swallow today which I reviewed with the radiologist. There is no evidence of a fixed stricture in the esophagus. There are numerous tertiary contractions. There is a hiatal hernia.  Review of Systems Currently denies chest pain or shortness of breath  Past Medical History  Diagnosis Date  . HTN (hypertension)   . Cardiomyopathy, ischemic   . Presence of permanent cardiac pacemaker   . CHF (congestive heart failure)   . Myocardial infarction   . Apical mural thrombus 03/23/2013   Past Surgical History  Procedure Laterality Date  . Angioplasty / stenting femoral    . Cardiac defibrillator placement      ICD-St Jude   History   Social History  . Marital Status: Married    Spouse Name: N/A    Number of Children: N/A  . Years of Education: N/A   Occupational History  . Not on file.   Social History Main Topics  . Smoking status: Former Smoker    Types: Cigarettes    Quit date: 03/21/1990  . Smokeless tobacco: Not on file  . Alcohol Use: No  . Drug Use: No  . Sexual Activity: Not on file   Other Topics Concern  . Not on file   Social History Narrative  . No narrative on file   family history is not on file. Current facility-administered medications:0.9 %  sodium chloride infusion, 250 mL, Intravenous, PRN, Maretta Bees, MD;  acetaminophen (TYLENOL) suppository 650 mg,  650 mg, Rectal, Q6H PRN, Maretta Bees, MD;  acetaminophen (TYLENOL) tablet 650 mg, 650 mg, Oral, Q6H PRN, Maretta Bees, MD;  albuterol (PROVENTIL) (2.5 MG/3ML) 0.083% nebulizer solution 2.5 mg, 2.5 mg, Nebulization, Q2H PRN, Maretta Bees, MD alum & mag hydroxide-simeth (MAALOX/MYLANTA) 200-200-20 MG/5ML suspension 30 mL, 30 mL, Oral, Q6H PRN, Maretta Bees, MD;  aspirin chewable tablet 162 mg, 162 mg, Oral, QHS, Maretta Bees, MD, 162 mg at 03/23/13 2325;  carvedilol (COREG) tablet 3.125 mg, 3.125 mg, Oral, BID WC, Maretta Bees, MD, 3.125 mg at 03/24/13 8937;  guaiFENesin (MUCINEX) 12 hr tablet 600 mg, 600 mg, Oral, BID, Maretta Bees, MD, 600 mg at 03/23/13 2232 guaiFENesin-dextromethorphan (ROBITUSSIN DM) 100-10 MG/5ML syrup 5 mL, 5 mL, Oral, Q4H PRN, Maretta Bees, MD;  heparin ADULT infusion 100 units/mL (25000 units/250 mL), 1,100 Units/hr, Intravenous, Continuous, Colleen Can, [facility], Last Rate: 11 mL/hr at 03/24/13 0450, 1,100 Units/hr at 03/24/13 0450 ipratropium-albuterol (DUONEB) 0.5-2.5 (3) MG/3ML nebulizer solution 3 mL, 3 mL, Nebulization, BID, Maretta Bees, MD, 3 mL at 03/24/13 0804;  levofloxacin (LEVAQUIN) IVPB 750 mg, 750 mg, Intravenous, Q48H, Shanker Levora Dredge, MD;  niacin (NIASPAN) CR tablet 500 mg, 500 mg, Oral, QHS, Kendra P Hiatt, RPH, 500 mg at 03/23/13 2235;  nitroGLYCERIN (NITROSTAT) SL tablet 0.4 mg,  0.4 mg, Sublingual, Q5 min PRN, Maretta BeesShanker M Ghimire, MD ondansetron (ZOFRAN) injection 4 mg, 4 mg, Intravenous, Q6H PRN, Maretta BeesShanker M Ghimire, MD;  ondansetron (ZOFRAN) tablet 4 mg, 4 mg, Oral, Q6H PRN, Maretta BeesShanker M Ghimire, MD;  oseltamivir (TAMIFLU) capsule 75 mg, 75 mg, Oral, BID, Shanker Levora DredgeM Ghimire, MD;  pantoprazole (PROTONIX) EC tablet 40 mg, 40 mg, Oral, Daily, Maretta BeesShanker M Ghimire, MD, 40 mg at 03/23/13 1028 potassium chloride SA (K-DUR,KLOR-CON) CR tablet 20 mEq, 20 mEq, Oral, Daily, Maretta BeesShanker M Ghimire, MD, 20 mEq at 03/23/13 1650;  [START ON  03/25/2013] predniSONE (DELTASONE) tablet 30 mg, 30 mg, Oral, Q breakfast, Maretta BeesShanker M Ghimire, MD;  simvastatin (ZOCOR) tablet 10 mg, 10 mg, Oral, q1800, Maretta BeesShanker M Ghimire, MD, 10 mg at 03/23/13 1650;  sodium chloride 0.9 % injection 3 mL, 3 mL, Intravenous, Q12H, Maretta BeesShanker M Ghimire, MD, 3 mL at 03/23/13 1029 sodium chloride 0.9 % injection 3 mL, 3 mL, Intravenous, Q12H, Maretta BeesShanker M Ghimire, MD, 3 mL at 03/23/13 2232;  sodium chloride 0.9 % injection 3 mL, 3 mL, Intravenous, PRN, Maretta BeesShanker M Ghimire, MD No Known Allergies   Objective:     BP 104/63  Pulse 65  Temp(Src) 97.6 F (36.4 C) (Oral)  Resp 20  Ht 5\' 9"  (1.753 m)  Wt 84.2 kg (185 lb 10 oz)  BMI 27.40 kg/m2  SpO2 95%  He is in no distress  Nonicteric  Heart regular rhythm  Lungs clear  Abdomen: Bowel sounds normal, soft, nontender  Laboratory No components found with this basename: d1      Assessment:     Chronic intermittent dysphagia most likely secondary to esophageal dysmotility. Barium swallow was done today and was reviewed with the radiologist. There is no evidence of a fixed esophageal stricture, and there is nothing to dilate. I see nothing on this barium swallow to prevent a TEE from being done.      Plan:     In regards to his dysphagia he needs to eat slowly, chew his food well, keep the food moist. Further evaluation per cardiology. I see no indication to do EGD or esophageal dilatation. I see no contraindication to TEE.  Call us again if we can be of any further assistance.    Component Value Date/Time   WBC 16.1* 03/24/2013 0045   HGB 14.4 03/24/2013 0045   HCT 42.4 03/24/2013 0045   PLT 187 03/24/2013 0045   ALT 15 03/21/2013 1130   AST 19 03/21/2013 1130   NA 133* 03/24/2013 0045   K 3.9 03/24/2013 0045   CL 92* 03/24/2013 0045   CREATININE 2.17* 03/24/2013 0045   BUN 57* 03/24/2013 0045   CO2 28 03/24/2013 0045   CALCIUM 8.6 03/24/2013 0045   ALKPHOS 131* 03/21/2013 1130

## 2013-03-24 NOTE — Progress Notes (Signed)
PATIENT DETAILS Name: [name] Age: 79 y.o. Sex: male Date of Birth: [birth year] Admit Date: 03/21/2013 Admitting Physician Theressa Millard, MD [id number], MD  Subjective: No major issues overnight  Assessment/Plan: Acute respiratory failure - Multifactorial, secondary to acute bronchitis, mild systolic heart failure. - Continue to treat with  Prednisone (but taper), nebulized bronchodilators and antibiotic therapy.Stop lasix given rapid rise in creatinine - As noted in H&P, CT angiogram of the chest done at [facility] was negative. -Lower ext Dopplers neg -Echocardiogram-shows depressed EF with possible Apical thrombus (see below)  Acute systolic heart failure  - Suspect mild CHF decompensation- secondary to bronchitis/URI - Significantly better, and now clinically compensated -stop lasix, and Lisinopril given ARF,  Echo confirms moderately decreased EF with possible Apical thrombus -continue cautiously with Coreg - Taper off oxygen, and ambulate   Acute bronchitis -Likely secondary to viral syndrome-per paper chart-influenza PCR negative at [facility] Respiratory virus panel done here is positive for Influenza A-start Tamiflu - Continue with prednisone-but taper, continue nebulized bronchodilators, empiric Levaquin. - Taper off oxygen and ambulate.  Influenza A -as above  ARF -secondary to Lasix. -stop lasix and ACEI-repeat BMET in am  Possible Apical Thrombus -on heprain gtt-cards planning TEE  Hx of Dysphagia -will check a barium esophagogram -will try to get records from [facility].  Elevated troponin level  - cardiology consultation obtained, no further workup needed.  -? Demand ischemia  - Continue aspirin, statins and beta blockers  - Troponins were cycled here, they have been negative. Suspect that the troponins done at [facility] was false-positive.  HYPERTENSION - Controlled with lisinopril and Coreg  History  of coronary artery disease - Continued aspirin, Coreg and statins.  Disposition: Remain inpatient   DVT Prophylaxis: Not needed as on Heparin gtt  Code Status: Full code   Family Communication None at bedside  Procedures:  none  CONSULTS:  cardiology  MEDICATIONS: Scheduled Meds: . aspirin  162 mg Oral QHS  . carvedilol  3.125 mg Oral BID WC  . guaiFENesin  600 mg Oral BID  . ipratropium-albuterol  3 mL Nebulization BID  . levofloxacin (LEVAQUIN) IV  750 mg Intravenous Q48H  . niacin  500 mg Oral QHS  . pantoprazole  40 mg Oral Daily  . potassium chloride  20 mEq Oral Daily  . [START ON 03/25/2013] predniSONE  30 mg Oral Q breakfast  . simvastatin  10 mg Oral q1800  . sodium chloride  3 mL Intravenous Q12H  . sodium chloride  3 mL Intravenous Q12H   Continuous Infusions: . heparin 1,100 Units/hr (03/24/13 0450)   PRN Meds:.sodium chloride, acetaminophen, acetaminophen, albuterol, alum & mag hydroxide-simeth, guaiFENesin-dextromethorphan, nitroGLYCERIN, ondansetron (ZOFRAN) IV, ondansetron, sodium chloride  Antibiotics: Anti-infectives   Start     Dose/Rate Route Frequency Ordered Stop   03/24/13 1400  levofloxacin (LEVAQUIN) IVPB 750 mg     750 mg 100 mL/hr over 90 Minutes Intravenous Every 48 hours 03/22/13 1508     03/21/13 1330  levofloxacin (LEVAQUIN) IVPB 500 mg  Status:  Discontinued     500 mg 100 mL/hr over 60 Minutes Intravenous Every 24 hours 03/21/13 1243 03/22/13 1508       PHYSICAL EXAM: Vital signs in last 24 hours: Filed Vitals:   03/23/13 2055 03/24/13 0401 03/24/13 0804 03/24/13 0826  BP:  114/59  104/63  Pulse:  68  65  Temp:  97.5 F (36.4 C)  97.6 F (36.4  C)  TempSrc:  Oral  Oral  Resp:  20  20  Height:      Weight:  84.2 kg (185 lb 10 oz)    SpO2: 97% 95% 96% 95%    Weight change: -0.625 kg (-1 lb 6 oz) Filed Weights   03/22/13 1700 03/23/13 0730 03/24/13 0401  Weight: 83.825 kg (184 lb 12.8 oz) 83.2 kg (183 lb 6.8 oz)  84.2 kg (185 lb 10 oz)   Body mass index is 27.4 kg/(m^2).   Gen Exam: Awake and alert with clear speech.   Neck: Supple, No JVD.   Chest: B/L Clear.  No rhonchi heard at all. CVS: S1 S2 Regular, no murmurs.  Abdomen: soft, BS +, non tender, non distended.  Extremities: no edema, lower extremities warm to touch. Neurologic: Non Focal.   Skin: No Rash.   Wounds: N/A.   Intake/Output from previous day:  Intake/Output Summary (Last 24 hours) at 03/24/13 1015 Last data filed at 03/24/13 0940  Gross per 24 hour  Intake    480 ml  Output   1150 ml  Net   -670 ml     LAB RESULTS: CBC  Recent Labs Lab 03/21/13 1555 03/22/13 0245 03/23/13 1310 03/24/13 0045  WBC 9.4 8.1 15.8* 16.1*  HGB 15.4 14.9 15.1 14.4  HCT 45.0 43.9 43.7 42.4  PLT 140* 159 179 187  MCV 93.2 92.2 92.0 91.8  MCH 31.9 31.3 31.8 31.2  MCHC 34.2 33.9 34.6 34.0  RDW 13.4 13.1 13.3 13.4    Chemistries   Recent Labs Lab 03/21/13 1130 03/21/13 1555 03/22/13 0245 03/24/13 0045  NA 136*  --  132* 133*  K 3.9  --  4.2 3.9  CL 95*  --  91* 92*  CO2 28  --  27 28  GLUCOSE 125*  --  210* 219*  BUN 14  --  22 57*  CREATININE 1.24 1.46* 1.38* 2.17*  CALCIUM 9.0  --  9.2 8.6    CBG:  Recent Labs Lab 03/22/13 0738 03/23/13 0609 03/24/13 0620  GLUCAP 221* 200* 148*    GFR Estimated Creatinine Clearance: 29 ml/min (by C-G formula based on Cr of 2.17).  Coagulation profile No results found for this basename: INR, PROTIME,  in the last 168 hours  Cardiac Enzymes  Recent Labs Lab 03/21/13 1130 03/21/13 1555 03/21/13 2230  TROPONINI <0.30 <0.30 <0.30    No components found with this basename: POCBNP,  No results found for this basename: DDIMER,  in the last 72 hours No results found for this basename: HGBA1C,  in the last 72 hours No results found for this basename: CHOL, HDL, LDLCALC, TRIG, CHOLHDL, LDLDIRECT,  in the last 72 hours No results found for this basename: TSH, T4TOTAL,  FREET3, T3FREE, THYROIDAB,  in the last 72 hours No results found for this basename: VITAMINB12, FOLATE, FERRITIN, TIBC, IRON, RETICCTPCT,  in the last 72 hours No results found for this basename: LIPASE, AMYLASE,  in the last 72 hours  Urine Studies No results found for this basename: UACOL, UAPR, USPG, UPH, UTP, UGL, UKET, UBIL, UHGB, UNIT, UROB, ULEU, UEPI, UWBC, URBC, UBAC, CAST, CRYS, UCOM, BILUA,  in the last 72 hours  MICROBIOLOGY: Recent Results (from the past 240 hour(s))  MRSA PCR SCREENING     Status: None   Collection Time    03/21/13  8:13 AM      Result Value Ref Range Status   MRSA by PCR NEGATIVE  NEGATIVE Final  Comment:            The GeneXpert MRSA Assay (FDA     approved for NASAL specimens     only), is one component of a     comprehensive MRSA colonization     surveillance program. It is not     intended to diagnose MRSA     infection nor to guide or     monitor treatment for     MRSA infections.  RESPIRATORY VIRUS PANEL     Status: Abnormal   Collection Time    03/21/13  1:34 PM      Result Value Ref Range Status   Source - RVPAN NASOPHARYNGEAL   Final   Respiratory Syncytial Virus A NOT DETECTED   Final   Respiratory Syncytial Virus B NOT DETECTED   Final   Influenza A DETECTED (*)  Final   Influenza B NOT DETECTED   Final   Parainfluenza 1 NOT DETECTED   Final   Parainfluenza 2 NOT DETECTED   Final   Parainfluenza 3 NOT DETECTED   Final   Metapneumovirus NOT DETECTED   Final   Rhinovirus NOT DETECTED   Final   Adenovirus NOT DETECTED   Final   Influenza A H1 NOT DETECTED   Final   Influenza A H3 DETECTED (*)  Final   Comment: (NOTE)           Normal Reference Range for each Analyte: NOT DETECTED     Testing performed using the Luminex xTAG Respiratory Viral Panel test     kit.     This test was developed and its performance characteristics determined     by [employer]. It has not been cleared or approved by the [place]     Food and Drug  Administration. This test is used for clinical purposes.     It should not be regarded as investigational or for research. This     laboratory is certified under the [employer] (CLIA) as qualified to perform high complexity     clinical laboratory testing.     Performed at [place] STUDIES/RESULTS: Dg Chest 2 View  03/21/2013   CLINICAL DATA:  CHF, hypertension, history of ischemic cardiomyopathy, now with shortness of breath  EXAM: CHEST  2 VIEW  COMPARISON:  DG CHEST 2 VIEW dated 10/06/2003; DG CHEST 2 VIEW dated 09/29/2003  FINDINGS: Grossly unchanged cardiac silhouette and mediastinal contours. Stable positioning of support apparatus. The lungs are hyperexpanded with flattening of the bilateral may diaphragms of mild diffuse slightly nodular thickening of the pulmonary interstitium. No focal airspace opacities. No pleural effusion or pneumothorax. No definite evidence of edema. No acute osseus abnormalities.  IMPRESSION: Hyperexpanded lungs and bronchitic change without acute cardiopulmonary disease.   Electronically Signed   By: Sandi Mariscal M.D.   On: 03/21/2013 17:54    Oren Binet, MD  [facility] Pager:[phone]  If 7PM-7AM, please contact night-coverage [email or web address] Password [email or web address] 03/24/2013, 10:15 AM   LOS: 3 days

## 2013-03-24 NOTE — Progress Notes (Signed)
ANTICOAGULATION CONSULT NOTE - Follow Up Consult  Pharmacy Consult for heparin Indication: apical thrombus  Labs:  Recent Labs  03/21/13 1130  03/21/13 1555 03/21/13 2230 03/22/13 0245 03/23/13 1310 03/24/13 0045  HGB  --   < > 15.4  --  14.9 15.1 14.4  HCT  --   < > 45.0  --  43.9 43.7 42.4  PLT  --   < > 140*  --  159 179 187  HEPARINUNFRC  --   --   --   --   --   --  1.32*  CREATININE 1.24  --  1.46*  --  1.38*  --  2.17*  TROPONINI <0.30  --  <0.30 <0.30  --   --   --   < > = values in this interval not displayed.   Assessment: 79yo male supratherapeutic on heparin with initial dosing for apical thrombus; lab drawn correctly, did get 40mg  of Lovenox at 1300 with worsening renal function.  Goal of Therapy:  Heparin level 0.3-0.7 units/ml   Plan:  Will hold heparin gtt ~1hr then resume at lower rate of 1100 units/hr and check level in 8hr.  Vernard Gambles, PharmD, BCPS  03/24/2013,3:27 AM

## 2013-03-24 NOTE — H&P (View-Only) (Signed)
Asked to see back in consult secondary to 2 D Echo with suspicion for an apical thrombus.  Though the left ventricle and endocardium were poorly visualized. Regional wall motion could not be assessed.  79 y/o male with pmh of ischemic cardiomyopathy, VT s/p ICD 2005, CAD/MI 1992 with PTCA of LAD complicated by reocclusion and recanalization, previous RCA stending LHC 01/2000 by Dr. Riley KillStuckey EF 45%, 30-40% LAD stenosis, LCX normal, patent RCA 3.0 x 18 mm [address] stent, 50-70% stenosis distal RCA, 40% PLA, last cardiolite study 01/2000 extensive anterior/apical scar, no ischemia, EF 27%, chronic systolic HF on medical therapy, HTN followed by Dr. Ladona Ridgelaylor who presented at outside hospital with some ongoing congestion, productive cough, and elevated troponin 0.08.  He presented with flu-like symptoms and mildly positive troponin (labs pending[facility]. Denies any CP or HF symptoms. ECG stable. Doubt active cardiac issue currently. Troponin here was negative.  His mild bump may have been due to chronic HF or mild demand ischemia from his URI.   Subjective: No chest pain, no active SOB, did have productive cough thick green sputum today.  Objective: Vital signs in last 24 hours: Temp:  [97.5 F (36.4 C)-98 F (36.7 C)] 97.5 F (36.4 C) (02/16 0727) Pulse Rate:  [66-95] 82 (02/16 1413) Resp:  [20] 20 (02/16 1413) BP: (90-118)/(52-69) 90/57 mmHg (02/16 1413) SpO2:  [92 %-97 %] 95 % (02/16 1413) Weight:  [183 lb 6.8 oz (83.2 kg)-184 lb 12.8 oz (83.825 kg)] 183 lb 6.8 oz (83.2 kg) (02/16 0730) Weight change: -16 lb 11.2 oz (-7.575 kg) Last BM Date: 03/22/13 Intake/Output from previous day: 02/15 0701 - 02/16 0700 In: 570 [P.O.:570] Out: 1025 [Urine:1025] Intake/Output this shift: Total I/O In: 600 [P.O.:600] Out: 425 [Urine:425]  PE: General:Pleasant affect, NAD Skin:Warm and dry, brisk capillary refill HEENT:normocephalic, sclera clear, mucus membranes moist Neck:supple, no  JVD, no bruits  Heart:S1S2 RRR without murmur, gallup, rub or click Lungs: without rales,occ. rhonchi, no wheezes ZOX:WRUEAbd:soft, non tender, + BS, do not palpate liver spleen or masses Ext:no lower ext edema, 2+ pedal pulses, 2+ radial pulses Neuro:alert and oriented, MAE, follows commands, + facial symmetry   Lab Results:  Recent Labs  03/22/13 0245 03/23/13 1310  WBC 8.1 15.8*  HGB 14.9 15.1  HCT 43.9 43.7  PLT 159 179   BMET  Recent Labs  03/21/13 1130 03/21/13 1555 03/22/13 0245  NA 136*  --  132*  K 3.9  --  4.2  CL 95*  --  91*  CO2 28  --  27  GLUCOSE 125*  --  210*  BUN 14  --  22  CREATININE 1.24 1.46* 1.38*  CALCIUM 9.0  --  9.2    Recent Labs  03/21/13 1555 03/21/13 2230  TROPONINI <0.30 <0.30    No results found for this basename: CHOL,  HDL,  LDLCALC,  LDLDIRECT,  TRIG,  CHOLHDL   No results found for this basename: HGBA1C     No results found for this basename: TSH    Hepatic Function Panel  Recent Labs  03/21/13 1130  PROT 7.8  ALBUMIN 3.7  AST 19  ALT 15  ALKPHOS 131*  BILITOT 0.4   No results found for this basename: CHOL,  in the last 72 hours No results found for this basename: PROTIME,  in the last 72 hours     Studies/Results: Dg Chest 2 View  03/21/2013   CLINICAL DATA:  CHF, hypertension,  history of ischemic cardiomyopathy, now with shortness of breath  EXAM: CHEST  2 VIEW  COMPARISON:  DG CHEST 2 VIEW dated 10/06/2003; DG CHEST 2 VIEW dated 09/29/2003  FINDINGS: Grossly unchanged cardiac silhouette and mediastinal contours. Stable positioning of support apparatus. The lungs are hyperexpanded with flattening of the bilateral may diaphragms of mild diffuse slightly nodular thickening of the pulmonary interstitium. No focal airspace opacities. No pleural effusion or pneumothorax. No definite evidence of edema. No acute osseus abnormalities.  IMPRESSION: Hyperexpanded lungs and bronchitic change without acute cardiopulmonary disease.    Electronically Signed   By: Simonne Come M.D.   On: 03/21/2013 17:54   2D Echo: Left ventricle: Left ventricle and endocardium are poorly visualized. Regional wall motion cannot be assessed. There is at least moderate systolic LV dysfunction and a high suspicion for an apical thrombus. A further work-up with either TEE or cardiac MRI is recommended. The cavity size was moderately dilated. Doppler parameters are consistent with abnormal left ventricular relaxation (grade 1 diastolic dysfunction). Doppler parameters are consistent with elevated ventricular end-diastolic filling pressure. - Aortic valve: No regurgitation. - Mitral valve: No regurgitation. - Right ventricle: Systolic function was normal. - Right atrium: The atrium was normal in size. - Pulmonary arteries: Systolic pressure was within the normal range. - Inferior vena cava: The vessel was normal in size. - Pericardium, extracardiac: There was no pericardial effusion.    Medications: I have reviewed the patient's current medications. Scheduled Meds: . aspirin  162 mg Oral QHS  . carvedilol  3.125 mg Oral BID WC  . [START ON 03/24/2013] furosemide  40 mg Oral Daily  . guaiFENesin  600 mg Oral BID  . ipratropium-albuterol  3 mL Nebulization TID  . [START ON 03/24/2013] levofloxacin (LEVAQUIN) IV  750 mg Intravenous Q48H  . lisinopril  40 mg Oral Daily  . niacin  500 mg Oral QHS  . pantoprazole  40 mg Oral Daily  . potassium chloride  20 mEq Oral Daily  . predniSONE  40 mg Oral Q breakfast  . simvastatin  10 mg Oral q1800  . sodium chloride  3 mL Intravenous Q12H  . sodium chloride  3 mL Intravenous Q12H   Continuous Infusions: . heparin     PRN Meds:.sodium chloride, acetaminophen, acetaminophen, albuterol, alum & mag hydroxide-simeth, guaiFENesin-dextromethorphan, nitroGLYCERIN, ondansetron (ZOFRAN) IV, ondansetron, sodium chloride  Assessment/Plan: Principal Problem:   Elevated troponin at [facility] but  normal at [facility] Active Problems:   HYPERTENSION   Coronary atherosclerosis of native coronary artery   Chronic systolic heart failure   CHF exacerbation   Acute bronchitis   Apical mural thrombus  PLAN: heparin is being started.  Would plan for TEE.  IF positive would need anticoagulation.  No history of falls or GI bleed.    MD to eval.  LOS: 2 days   Time spent with pt. : 20 minutes. [facility] R  Nurse Practitioner Certified Pager [phone] or after 5pm and on weekends call [phone] 03/23/2013, 4:01 PM  Patient seen with NP, agree with the above note.    Patient has a questionable apical thrombus with very difficult echo windows, even with Definity.  He will need anticoagulation with warfarin if thrombus is there.  Start heparin gtt now.   He cannot have an MRI with ICD.  We need to confirm clot before committing him to coumadin.  I think he needs a TEE to confirm.  He tells me that he has had significant problem recently with  food getting stuck in his throat and needing to regurgitate.  He has had what sounds like esophageal dilatation before.  I think that he needs an EGD with possible dilatation prior to attempted a TEE.  Will involve GI.   Marca Ancona 03/23/2013 4:41 PM

## 2013-03-24 NOTE — Interval H&P Note (Signed)
History and Physical Interval Note:  03/24/2013 2:35 PM  [name]  has presented today for surgery, with the diagnosis of r/o thrombus  The various methods of treatment have been discussed with the patient and family. After consideration of risks, benefits and other options for treatment, the patient has consented to  Procedure(s): TRANSESOPHAGEAL ECHOCARDIOGRAM (TEE) (N/A) as a surgical intervention .  The patient's history has been reviewed, patient examined, no change in status, stable for surgery.  I have reviewed the patient's chart and labs.  Questions were answered to the patient's satisfaction.     [name] [employer]

## 2013-03-24 NOTE — Progress Notes (Signed)
Stopped Heparin drip per pharmacy. Instructed by pharmacist to restart Heparin drip at 0415 at 88ml/hr. Patient has no signs or symptoms of bleeding noted. Will continue to monitor patient to end of shift.

## 2013-03-24 NOTE — Progress Notes (Signed)
Echocardiogram Echocardiogram Transesophageal has been performed.  Dorothey Baseman 03/24/2013, 2:30 PM

## 2013-03-24 NOTE — Progress Notes (Signed)
Inpatient Diabetes Program Recommendations  AACE/ADA: New Consensus Statement on Inpatient Glycemic Control (2013)  Target Ranges:  Prepandial:   less than 140 mg/dL      Peak postprandial:   less than 180 mg/dL (1-2 hours)      Critically ill patients:  140 - 180 mg/dL   Results for [name], [name] (MRN [id number]) as of 03/24/2013 12:19  Ref. Range 03/21/2013 11:30 03/22/2013 02:45 03/24/2013 00:45  Glucose Latest Range: 70-99 mg/dL 160 (H) 737 (H) 106 (H)   Diabetes history: No Outpatient Diabetes medications: NA Current orders for Inpatient glycemic control: None  Inpatient Diabetes Program Recommendations Correction (SSI): While inpatient, please consider ordering CBGs with Novolog correction ACHS. HgbA1C: Please consider ordering an A1C to evaluate glycemic control over the past 2-3 months.  Note: Patient does not have a documented history of diabetes.  Noted patient is receiving steroids which is likely cause of elevated glucose.  While inpatient and receiving steroids, please consider ordering CBGs with Novolog correction scale. Also, please order an A1C.   Thanks, Orlando Penner, RN, MSN, CCRN Diabetes Coordinator Inpatient Diabetes Program [phone] (Team Pager) [phone] (AP office) [phone] [facility] office)

## 2013-03-24 NOTE — CV Procedure (Addendum)
Procedure: TEE  Indication: Assess for LV thrombus  Sedation: Versed 4 mg IV, Fentanyl 25 mcg IV  Findings: Please see echo section for full report.  The left ventricle was mildly dilated.  EF 20-25%.  There was severe hypokinesis to akinesis of the mid to apical ventricular segments globally.  There was a possible small calcified apical thrombus (cannot rule this out).  There was trivial MR.  The right ventricle was difficult to view but suspect normal size and mildly decreased systolic function.  There was grade IV plaque noted in the aortic arch.    Impression: Suspect calcified (old) thrombus at the apex.  Think it would be reasonable to start warfarin.  As it appears calcified and old, do not think full heparin/warfarin overlap would be necessary.   Marca Ancona 03/24/2013 3:01 PM

## 2013-03-24 NOTE — Progress Notes (Signed)
ANTICOAGULATION CONSULT NOTE - Follow Up Consult  Pharmacy Consult for Heparin/Coumadin Indication: LV thrombus  No Known Allergies  Patient Measurements: Height: 5\' 9"  (175.3 cm) Weight: 185 lb 10 oz (84.2 kg) IBW/kg (Calculated) : 70.7 Heparin Dosing Weight: 84.2 kg  Vital Signs: Temp: 96.7 F (35.9 C) (02/17 1358) Temp src: Axillary (02/17 1358) BP: 102/67 mmHg (02/17 1510) Pulse Rate: 72 (02/17 1510)  Labs:  Recent Labs  03/21/13 1555 03/21/13 2230 03/22/13 0245 03/23/13 1310 03/24/13 0045 03/24/13 1151  HGB 15.4  --  14.9 15.1 14.4  --   HCT 45.0  --  43.9 43.7 42.4  --   PLT 140*  --  159 179 187  --   HEPARINUNFRC  --   --   --   --  1.32* 1.10*  CREATININE 1.46*  --  1.38*  --  2.17*  --   TROPONINI <0.30 <0.30  --   --   --   --     Estimated Creatinine Clearance: 29 ml/min (by C-G formula based on Cr of 2.17).   Assessment: 9 YOM with suspected apical thrombus on ECHO (poorly visualized) on IV heparin.   Anticoagulation: Last lovenox dose 2/16, 13:13PM (40mg ). Doppler neg for DVT. Heparin level 1.32 overnight could be from residual LMWH and worsening renal function (Scr 1.38>>2.17). Heparin level today 1.1 remains elevated despite adjustment. --2/17 TEE: There was a possible small calcified apical thrombus (cannot rule this out). As it appears calcified and old, do not think full heparin/warfarin overlap would be necessary.   Infectious Disease - Tc 96.7, WBC up to 16.1 (on steroids). Bronchitis. Levaquin adjusted 2/16, currently on 750 mg IV q48hr for crcl 10-29. Tamiflu added for +Flu. Levaquin IV 2/14>> 2/14 - Flu A - POS  Cardiovascular: ICM (EF 27%), hx VT, has ICD. Hx PCTA. MI 85.Trop neg. CHF exac. BNP down to 473; 104/63, HR 65 Meds:. ASA 162, Coreg (held in pm x 2 d/t low BP), Niaspan, Zocor 10, KCl 20 daily; (Lasix & Lisin dc'd 2/17 d/t incr Scr)  Endocrinology - not diabetic. CBGs 148-200; Pred 40 mg daily  Gastrointestinal / Nutrition -  LTFs ok, albumin 3.7; PO PPI. Some swallowing issues. May need EGD & dilitation  Neurology - no issues  Nephrology: Scr 1.38->2.17, crcl ~29. K+ 3.9, Na 132->133. Lasix & Lisinopril dc'd; I/O -210 yest  Pulmonary: 9% on 3L. duonebs, Mucinex  Heme/Onc: Hgb 14.4, PLTC 187  PTA Medication Issues: meds addressed  Best Practices: IV hep/Coumadin, PO PPI   Goal of Therapy:  Heparin level 0.3-0.7 units/ml Monitor platelets by anticoagulation protocol: Yes   Plan:  When pt back on floor, hold IV heparin x 1 hr, then decrease to 900 units/hr.  Coumadin 7.5mg  po x 1 tonight. Daily INRs   Akshaya Toepfer S. Merilynn Finland, PharmD, BCPS Clinical Staff Pharmacist Pager [phone]  Misty Stanley Stillinger 03/24/2013,3:20 PM

## 2013-03-25 ENCOUNTER — Encounter (HOSPITAL_COMMUNITY): Payer: Self-pay | Admitting: Cardiology

## 2013-03-25 DIAGNOSIS — J111 Influenza due to unidentified influenza virus with other respiratory manifestations: Secondary | ICD-10-CM | POA: Diagnosis present

## 2013-03-25 DIAGNOSIS — J9600 Acute respiratory failure, unspecified whether with hypoxia or hypercapnia: Secondary | ICD-10-CM | POA: Diagnosis present

## 2013-03-25 LAB — BASIC METABOLIC PANEL
BUN: 42 mg/dL — AB (ref 6–23)
CHLORIDE: 96 meq/L (ref 96–112)
CO2: 30 mEq/L (ref 19–32)
CREATININE: 1.69 mg/dL — AB (ref 0.50–1.35)
Calcium: 8.5 mg/dL (ref 8.4–10.5)
GFR calc Af Amer: 44 mL/min — ABNORMAL LOW (ref >90)
GFR calc non Af Amer: 38 mL/min — ABNORMAL LOW (ref >90)
Glucose, Bld: 148 mg/dL — ABNORMAL HIGH (ref 70–99)
POTASSIUM: 4.1 meq/L (ref 3.7–5.3)
Sodium: 137 mEq/L (ref 137–147)

## 2013-03-25 LAB — CBC
HCT: 41.4 % (ref 39.0–52.0)
HEMOGLOBIN: 13.8 g/dL (ref 13.0–17.0)
MCH: 31.4 pg (ref 26.0–34.0)
MCHC: 33.3 g/dL (ref 30.0–36.0)
MCV: 94.1 fL (ref 78.0–100.0)
Platelets: 150 10*3/uL (ref 150–400)
RBC: 4.4 MIL/uL (ref 4.22–5.81)
RDW: 13.5 % (ref 11.5–15.5)
WBC: 10.6 10*3/uL — AB (ref 4.0–10.5)

## 2013-03-25 LAB — PROTIME-INR
INR: 1.03 (ref 0.00–1.49)
Prothrombin Time: 13.3 seconds (ref 11.6–15.2)

## 2013-03-25 LAB — GLUCOSE, CAPILLARY: GLUCOSE-CAPILLARY: 136 mg/dL — AB (ref 70–99)

## 2013-03-25 LAB — HEPARIN LEVEL (UNFRACTIONATED): Heparin Unfractionated: 0.55 IU/mL (ref 0.30–0.70)

## 2013-03-25 MED ORDER — WARFARIN SODIUM 7.5 MG PO TABS
7.5000 mg | ORAL_TABLET | Freq: Once | ORAL | Status: DC
Start: 2013-03-25 — End: 2013-03-25
  Filled 2013-03-25: qty 1

## 2013-03-25 MED ORDER — OSELTAMIVIR PHOSPHATE 30 MG PO CAPS: 30.0000 mg | ORAL_CAPSULE | Freq: Two times a day (BID) | ORAL | Status: DC

## 2013-03-25 MED ORDER — ALBUTEROL SULFATE HFA 108 (90 BASE) MCG/ACT IN AERS: 2.0000 | INHALATION_SPRAY | Freq: Four times a day (QID) | RESPIRATORY_TRACT | Status: DC | PRN

## 2013-03-25 MED ORDER — OSELTAMIVIR PHOSPHATE 30 MG PO CAPS
30.0000 mg | ORAL_CAPSULE | Freq: Two times a day (BID) | ORAL | Status: DC
  Administered 2013-03-25: 30 mg via ORAL
  Filled 2013-03-25 (×2): qty 1

## 2013-03-25 MED ORDER — WARFARIN SODIUM 5 MG PO TABS: ORAL_TABLET | ORAL | Status: DC

## 2013-03-25 MED ORDER — GUAIFENESIN ER 600 MG PO TB12: 600.0000 mg | ORAL_TABLET | Freq: Two times a day (BID) | ORAL | Status: DC

## 2013-03-25 MED ORDER — PREDNISONE 10 MG PO TABS: ORAL_TABLET | ORAL | Status: DC

## 2013-03-25 NOTE — Progress Notes (Signed)
SATURATION QUALIFICATIONS: (This note is used to comply with regulatory documentation for home oxygen)  Patient Saturations on Room Air at Rest = 96%  Patient Saturations on Room Air while Ambulating = 86%  Patient Saturations on 2 Liters of oxygen while Ambulating = 96%  Please briefly explain why patient needs home oxygen: When ambulating 33ft in hallway, patient's oxygen saturation dropped to 86%.  Patient's oxygen saturation rose to 95% while ambulating with 2L oxygen via nasal cannula.

## 2013-03-25 NOTE — Progress Notes (Signed)
ANTICOAGULATION CONSULT NOTE - Follow Up Consult  Pharmacy Consult for Heparin  Indication: Apical thrombus  No Known Allergies  Patient Measurements: Height: 5\' 9"  (175.3 cm) Weight: 185 lb 10 oz (84.2 kg) IBW/kg (Calculated) : 70.7  Vital Signs: Temp: 98.7 F (37.1 C) (02/17 2138) Temp src: Oral (02/17 2138) BP: 94/57 mmHg (02/17 2138) Pulse Rate: 70 (02/17 2138)  Labs:  Recent Labs  03/22/13 0245 03/23/13 1310 03/24/13 0045 03/24/13 1151 03/24/13 2238  HGB 14.9 15.1 14.4  --   --   HCT 43.9 43.7 42.4  --   --   PLT 159 179 187  --   --   LABPROT  --   --   --   --  13.5  INR  --   --   --   --  1.05  HEPARINUNFRC  --   --  1.32* 1.10* 0.46  CREATININE 1.38*  --  2.17*  --   --     Estimated Creatinine Clearance: 29 ml/min (by C-G formula based on Cr of 2.17).   Medications:  Heparin 900 units/hr  Assessment: 79 y/o on heparin/warfarin for apical thrombus, HL is 0.46 after rate decrease, other labs as above.   Goal of Therapy:  Heparin level 0.3-0.7 units/ml Monitor platelets by anticoagulation protocol: Yes   Plan:  -Continue heparin at 900 units/hr -F/U HL with AM labs -Daily CBC/HL -Warfarin per previous note -Monitor for bleeding  Abran Duke 03/25/2013,12:02 AM

## 2013-03-25 NOTE — Progress Notes (Signed)
Patient discharged to home with family.  IV removed prior to discharge; IV site clean, dry, and intact.  Discharge information discussed with patient; wife at bedside.  Patient and wife voice understanding of discharge instructions, education, and medications.  Patient denies any questions or concerns at this time.  Ensured patient had prescriptions for all of his new medications and follow-up appointments.

## 2013-03-25 NOTE — Progress Notes (Signed)
       PATIENT DETAILS Name: [name] Age: 79 y.o. Sex: male Date of Birth: 08/03/1934 Admit Date: 03/21/2013 Admitting Physician Harvette C Jenkins, MD PCP:REID,INDIA, MD  Subjective: No major issues overnight. Breathing is ok  Assessment/Plan: Acute respiratory failure - Multifactorial, secondary to acute bronchitis, mild systolic heart failure. - Continue to treat with  Prednisone (but taper), nebulized bronchodilators. Will stop Levaquin as Influenza PCR is positive and antibiotic therapy.Stop lasix given rapid rise in creatinine - As noted in H&P, CT angiogram of the chest done at [facility] was negative. -Lower ext Dopplers neg -Echocardiogram-shows depressed EF with possible Apical thrombus (see below)  Acute systolic heart failure  - Suspect mild CHF decompensation- secondary to bronchitis/URI - Significantly better, and now clinically compensated -stopped lasix, and Lisinopril given ARF,Transthoracic Echo confirms moderately decreased EF with possible Apical thrombus, confirmed by a TEE done on 2/17.EF on TEE 20-25% -continue cautiously with Coreg - Taper off oxygen, and ambulate   Acute bronchitis -Likely secondary to viral syndrome-per paper chart-influenza PCR negative at [facility].However Respiratory virus panel done here is positive for Influenza A-start Tamiflu - Continue with prednisone-but taper, continue nebulized bronchodilators. Will stop Levaquin - Taper off oxygen and ambulate.  Influenza A -as above  ARF -secondary to Lasix. -will resume ACEI when BP tolerates. Euvolemic on exam-no indication for Lasix  Calcified Apical Thrombus -on heprain gtt and coumadin-will need to d/w cards regarding timing of discharge and outpatient follow up  Hx of Dysphagia - barium esophagogram on 2/17 did not show any major abnormalities-seen by GI-no further work up  Elevated troponin level  - cardiology consultation obtained, no further workup  needed.  -? Demand ischemia  - Continue aspirin, statins and beta blockers  - Troponins were cycled here, they have been negative. Suspect that the troponins done at [facility] was false-positive.  HYPERTENSION - Controlled with Coreg, resume Lisinopril when BP tolerates  History of coronary artery disease - Continued aspirin, Coreg and statins.  Disposition: Remain inpatient -home in 1-2 days  DVT Prophylaxis: Not needed as on Heparin gtt/coumadin  Code Status: Full code   Family Communication None at bedside  Procedures:  none  CONSULTS:  cardiology  MEDICATIONS: Scheduled Meds: . aspirin  162 mg Oral QHS  . carvedilol  3.125 mg Oral BID WC  . guaiFENesin  600 mg Oral BID  . ipratropium-albuterol  3 mL Nebulization BID  . niacin  500 mg Oral QHS  . oseltamivir  75 mg Oral BID  . pantoprazole  40 mg Oral Daily  . potassium chloride  20 mEq Oral Daily  . predniSONE  30 mg Oral Q breakfast  . simvastatin  10 mg Oral q1800  . sodium chloride  3 mL Intravenous Q12H  . sodium chloride  3 mL Intravenous Q12H  . Warfarin - Pharmacist Dosing Inpatient   Does not apply q1800   Continuous Infusions: . sodium chloride    . heparin 900 Units/hr (03/24/13 1819)   PRN Meds:.sodium chloride, acetaminophen, acetaminophen, albuterol, alum & mag hydroxide-simeth, guaiFENesin-dextromethorphan, nitroGLYCERIN, ondansetron (ZOFRAN) IV, ondansetron, sodium chloride  Antibiotics: Anti-infectives   Start     Dose/Rate Route Frequency Ordered Stop   03/24/13 1400  levofloxacin (LEVAQUIN) IVPB 750 mg  Status:  Discontinued     750 mg 100 mL/hr over 90 Minutes Intravenous Every 48 hours 03/22/13 1508 03/25/13 0704   03/24/13 1200  oseltamivir (TAMIFLU) capsule 75 mg     75 mg   Oral 2 times daily 03/24/13 1019 03/29/13 0959   03/21/13 1330  levofloxacin (LEVAQUIN) IVPB 500 mg  Status:  Discontinued     500 mg 100 mL/hr over 60 Minutes Intravenous Every 24 hours 03/21/13 1243  03/22/13 1508       PHYSICAL EXAM: Vital signs in last 24 hours: Filed Vitals:   03/24/13 1824 03/24/13 2034 03/24/13 2138 03/25/13 0447  BP: 104/61  94/57 90/53  Pulse: 72  70 66  Temp:   98.7 F (37.1 C) 98.3 F (36.8 C)  TempSrc:   Oral Oral  Resp:   18 20  Height:      Weight:    84 kg (185 lb 3 oz)  SpO2:  93% 96% 95%    Weight change: 0.8 kg (1 lb 12.2 oz) Filed Weights   03/23/13 0730 03/24/13 0401 03/25/13 0447  Weight: 83.2 kg (183 lb 6.8 oz) 84.2 kg (185 lb 10 oz) 84 kg (185 lb 3 oz)   Body mass index is 27.33 kg/(m^2).   Gen Exam: Awake and alert with clear speech.   Neck: Supple, No JVD.   Chest: B/L Clear.  No rhonchi heard at all. CVS: S1 S2 Regular, no murmurs.  Abdomen: soft, BS +, non tender, non distended.  Extremities: no edema, lower extremities warm to touch. Neurologic: Non Focal.   Skin: No Rash.   Wounds: N/A.   Intake/Output from previous day:  Intake/Output Summary (Last 24 hours) at 03/25/13 0705 Last data filed at 03/25/13 0400  Gross per 24 hour  Intake 616.48 ml  Output    775 ml  Net -158.52 ml     LAB RESULTS: CBC  Recent Labs Lab 03/21/13 1555 03/22/13 0245 03/23/13 1310 03/24/13 0045 03/25/13 0353  WBC 9.4 8.1 15.8* 16.1* 10.6*  HGB 15.4 14.9 15.1 14.4 13.8  HCT 45.0 43.9 43.7 42.4 41.4  PLT 140* 159 179 187 150  MCV 93.2 92.2 92.0 91.8 94.1  MCH 31.9 31.3 31.8 31.2 31.4  MCHC 34.2 33.9 34.6 34.0 33.3  RDW 13.4 13.1 13.3 13.4 13.5    Chemistries   Recent Labs Lab 03/21/13 1130 03/21/13 1555 03/22/13 0245 03/24/13 0045 03/25/13 0353  NA 136*  --  132* 133* 137  K 3.9  --  4.2 3.9 4.1  CL 95*  --  91* 92* 96  CO2 28  --  _0 GLUCOSE 125*  --  210* 219* 148*  BUN 14  --  22 57* 42*  CREATININE 1.24 1.46* 1.38* 2.17* 1.69*  CALCIUM 9.0  --  9.2 8.6 8.5    CBG:  Recent Labs Lab 03/22/13 0738 03/23/13 0609 03/24/13 0620  GLUCAP 221* 200* 148*    GFR Estimated Creatinine Clearance: 37.2  ml/min (by C-G formula based on Cr of 1.69).  Coagulation profile  Recent Labs Lab 03/24/13 2238 03/25/13 0353  INR 1.05 1.03    Cardiac Enzymes  Recent Labs Lab 03/21/13 1130 03/21/13 1555 03/21/13 2230  TROPONINI <0.30 <0.30 <0.30    No components found with this basename: POCBNP,  No results found for this basename: DDIMER,  in the last 72 hours No results found for this basename: HGBA1C,  in the last 72 hours No results found for this basename: CHOL, HDL, LDLCALC, TRIG, CHOLHDL, LDLDIRECT,  in the last 72 hours No results found for this basename: TSH, T4TOTAL, FREET3, T3FREE, THYROIDAB,  in the last 72 hours No results found for this basename: VITAMINB12, FOLATE, FERRITIN, TIBC,  IRON, RETICCTPCT,  in the last 72 hours No results found for this basename: LIPASE, AMYLASE,  in the last 72 hours  Urine Studies No results found for this basename: UACOL, UAPR, USPG, UPH, UTP, UGL, UKET, UBIL, UHGB, UNIT, UROB, ULEU, UEPI, UWBC, URBC, UBAC, CAST, CRYS, UCOM, BILUA,  in the last 72 hours  MICROBIOLOGY: Recent Results (from the past 240 hour(s))  MRSA PCR SCREENING     Status: None   Collection Time    03/21/13  8:13 AM      Result Value Ref Range Status   MRSA by PCR NEGATIVE  NEGATIVE Final   Comment:            The GeneXpert MRSA Assay (FDA     approved for NASAL specimens     only), is one component of a     comprehensive MRSA colonization     surveillance program. It is not     intended to diagnose MRSA     infection nor to guide or     monitor treatment for     MRSA infections.  RESPIRATORY VIRUS PANEL     Status: Abnormal   Collection Time    03/21/13  1:34 PM      Result Value Ref Range Status   Source - RVPAN NASOPHARYNGEAL   Final   Respiratory Syncytial Virus A NOT DETECTED   Final   Respiratory Syncytial Virus B NOT DETECTED   Final   Influenza A DETECTED (*)  Final   Influenza B NOT DETECTED   Final   Parainfluenza 1 NOT DETECTED   Final    Parainfluenza 2 NOT DETECTED   Final   Parainfluenza 3 NOT DETECTED   Final   Metapneumovirus NOT DETECTED   Final   Rhinovirus NOT DETECTED   Final   Adenovirus NOT DETECTED   Final   Influenza A H1 NOT DETECTED   Final   Influenza A H3 DETECTED (*)  Final   Comment: (NOTE)           Normal Reference Range for each Analyte: NOT DETECTED     Testing performed using the [employer] xTAG Respiratory Viral Panel test     kit.     This test was developed and its performance characteristics determined     by [employer]. It has not been cleared or approved by the [place]     Food and Drug Administration. This test is used for clinical purposes.     It should not be regarded as investigational or for research. This     laboratory is certified under the [place] (CLIA) as qualified to perform high complexity     clinical laboratory testing.     Performed at [facility] STUDIES/RESULTS: Dg Chest 2 View  03/21/2013   CLINICAL DATA:  CHF, hypertension, history of ischemic cardiomyopathy, now with shortness of breath  EXAM: CHEST  2 VIEW  COMPARISON:  DG CHEST 2 VIEW dated 10/06/2003; DG CHEST 2 VIEW dated 09/29/2003  FINDINGS: Grossly unchanged cardiac silhouette and mediastinal contours. Stable positioning of support apparatus. The lungs are hyperexpanded with flattening of the bilateral may diaphragms of mild diffuse slightly nodular thickening of the pulmonary interstitium. No focal airspace opacities. No pleural effusion or pneumothorax. No definite evidence of edema. No acute osseus abnormalities.  IMPRESSION: Hyperexpanded lungs and bronchitic change without acute cardiopulmonary disease.   Electronically Signed  By: Sandi Mariscal M.D.   On: 03/21/2013 17:54    Oren Binet, MD  [facility] Pager:[phone]  If 7PM-7AM, please contact night-coverage [email or web address] Password [email or web address] 03/25/2013, 7:05 AM   LOS: 4 days

## 2013-03-25 NOTE — Progress Notes (Signed)
Patient currenlty had/has no signs or symptoms of bleeding throughout the night while being on Heparin drip. Will continue to monitor patient to end of shift.

## 2013-03-25 NOTE — Discharge Summary (Signed)
PATIENT DETAILS Name: [name] Age: 79 y.o. Sex: male Date of Birth: 05-08-1934 MRN: [id number]. Admit Date: 03/21/2013 Admitting Physician: Theressa Millard, MD BZJ:IRCV,ELFYB, MD  Recommendations for Outpatient Follow-up:  1. Currenlty off ACEI due to soft BP and ARF-resume when able 2. Please check BMET at next visit 3. Please follow up at [facility]madin Clinic on 2/23 and monitor INR and dose coumadin accordingly 4. Please check if patient still requires O2 at follow up 5. Refer to Pulmonology if still requiring O2 at follow up  PRIMARY DISCHARGE DIAGNOSIS:  Principal Problem:   Elevated troponin at [facility] but normal at [facility] Active Problems:   HYPERTENSION   Coronary atherosclerosis of native coronary artery   Chronic systolic heart failure   CHF exacerbation   Acute bronchitis   Apical mural thrombus   Influenza with other respiratory manifestations   Respiratory failure, acute      PAST MEDICAL HISTORY: Past Medical History  Diagnosis Date  . HTN (hypertension)   . Cardiomyopathy, ischemic   . Presence of permanent cardiac pacemaker   . CHF (congestive heart failure)   . Myocardial infarction   . Apical mural thrombus 03/23/2013    DISCHARGE MEDICATIONS:   Medication List    STOP taking these medications       lisinopril 20 MG tablet  Commonly known as:  PRINIVIL,ZESTRIL      TAKE these medications       albuterol 108 (90 BASE) MCG/ACT inhaler  Commonly known as:  PROVENTIL HFA;VENTOLIN HFA  Inhale 2 puffs into the lungs every 6 (six) hours as needed for wheezing or shortness of breath.     aspirin 81 MG tablet  Take 162 mg by mouth at bedtime.     carvedilol 3.125 MG tablet  Commonly known as:  COREG  Take 3.125 mg by mouth 2 (two) times daily with a meal.     guaiFENesin 600 MG 12 hr tablet  Commonly known as:  MUCINEX  Take 1 tablet (600 mg total) by mouth 2 (two) times daily.     NIASPAN 1000 MG CR tablet  Generic drug:   niacin  Take 500 mg by mouth at bedtime.     NITROSTAT 0.4 MG SL tablet  Generic drug:  nitroGLYCERIN  Place 0.4 mg under the tongue every 5 (five) minutes as needed.     omeprazole 20 MG capsule  Commonly known as:  PRILOSEC  Take 20 mg by mouth daily.     oseltamivir 30 MG capsule  Commonly known as:  TAMIFLU  Take 1 capsule (30 mg total) by mouth 2 (two) times daily.     pravastatin 20 MG tablet  Commonly known as:  PRAVACHOL  Take 20 mg by mouth daily. PCP will be changing to Lipitor in 5 weeks.     predniSONE 10 MG tablet  Commonly known as:  DELTASONE  - Take 3 tablets (30 mg) for 2 days, then,  - Take 2 tablets (20 mg) for 2 days, then,  - Take 1 tablets (10 mg) for 1 day, then,     warfarin 5 MG tablet  Commonly known as:  COUMADIN  - Take 7.5 mg (1.5 tablets) on 03/25/13  and 03/26/13,then  - Take 5 mg (1 tablet) till seen by Coumadin Clinic on 03/30/13        ALLERGIES:  No Known Allergies  BRIEF HPI:  See H&P, Labs, Consult and Test reports for all details in brief, [name] Daily  [name] is a 79 y.o. male with a Past Medical History of chronic systolic heart failure, ischemic cardiomyopathy, V. tach status post ICD in 2005, hypertension, dyslipidemia who presented with a 2 day history of cough, exertional dyspnea.  CONSULTATIONS:   cardiology  PERTINENT RADIOLOGIC STUDIES: Dg Chest 2 View  03/21/2013   CLINICAL DATA:  CHF, hypertension, history of ischemic cardiomyopathy, now with shortness of breath  EXAM: CHEST  2 VIEW  COMPARISON:  DG CHEST 2 VIEW dated 10/06/2003; DG CHEST 2 VIEW dated 09/29/2003  FINDINGS: Grossly unchanged cardiac silhouette and mediastinal contours. Stable positioning of support apparatus. The lungs are hyperexpanded with flattening of the bilateral may diaphragms of mild diffuse slightly nodular thickening of the pulmonary interstitium. No focal airspace opacities. No pleural effusion or pneumothorax. No definite evidence of edema. No acute  osseus abnormalities.  IMPRESSION: Hyperexpanded lungs and bronchitic change without acute cardiopulmonary disease.   Electronically Signed   By: Sandi Mariscal M.D.   On: 03/21/2013 17:54   Dg Esophagus  03/24/2013   CLINICAL DATA:  Dysphagia for solids with forced regurgitation. History of esophageal dilatation and reflux.  EXAM: ESOPHOGRAM / BARIUM SWALLOW / BARIUM TABLET STUDY  TECHNIQUE: Combined double contrast and single contrast examination performed using effervescent crystals, thick barium liquid, and thin barium liquid. The patient was observed with fluoroscopy swallowing a 51m barium sulphate tablet.  COMPARISON:  None.  FLUOROSCOPY TIME:  44 seconds  FINDINGS: Rapid sequence imaging of the pharynx in the AP and lateral projections demonstrates no mucosal abnormalities or laryngeal penetration. There is mild cervical spondylosis.  The esophageal motility is within normal limits aside from mild tertiary contractions. There is no evidence of esophageal ulceration, mass or fixed stricture. There is a small reducible hiatal hernia. With the water siphon test, no significant reflux was elicited. A 13 mm barium tablet passed without delay into the stomach.  IMPRESSION: 1. Small hiatal hernia without demonstrated reflux or fixed stricture. 2. A barium tablet passes without delay into the stomach. 3. Mild tertiary contractions of the esophagus.   Electronically Signed   By: BCamie PatienceM.D.   On: 03/24/2013 11:56     PERTINENT LAB RESULTS: CBC:  Recent Labs  03/24/13 0045 03/25/13 0353  WBC 16.1* 10.6*  HGB 14.4 13.8  HCT 42.4 41.4  PLT 187 150   CMET CMP     Component Value Date/Time   NA 137 03/25/2013 0353   K 4.1 03/25/2013 0353   CL 96 03/25/2013 0353   CO2 30 03/25/2013 0353   GLUCOSE 148* 03/25/2013 0353   BUN 42* 03/25/2013 0353   CREATININE 1.69* 03/25/2013 0353   CALCIUM 8.5 03/25/2013 0353   PROT 7.8 03/21/2013 1130   ALBUMIN 3.7 03/21/2013 1130   AST 19 03/21/2013 1130   ALT 15  03/21/2013 1130   ALKPHOS 131* 03/21/2013 1130   BILITOT 0.4 03/21/2013 1130   GFRNONAA 38* 03/25/2013 0353   GFRAA 44* 03/25/2013 0353    GFR Estimated Creatinine Clearance: 37.2 ml/min (by C-G formula based on Cr of 1.69). No results found for this basename: LIPASE, AMYLASE,  in the last 72 hours No results found for this basename: CKTOTAL, CKMB, CKMBINDEX, TROPONINI,  in the last 72 hours No components found with this basename: POCBNP,  No results found for this basename: DDIMER,  in the last 72 hours No results found for this basename: HGBA1C,  in the last 72 hours No results found for this basename: CHOL, HDL, LDLCALC, TRIG,  CHOLHDL, LDLDIRECT,  in the last 72 hours No results found for this basename: TSH, T4TOTAL, FREET3, T3FREE, THYROIDAB,  in the last 72 hours No results found for this basename: VITAMINB12, FOLATE, FERRITIN, TIBC, IRON, RETICCTPCT,  in the last 72 hours Coags:  Recent Labs  03/24/13 2238 03/25/13 0353  INR 1.05 1.03   Microbiology: Recent Results (from the past 240 hour(s))  MRSA PCR SCREENING     Status: None   Collection Time    03/21/13  8:13 AM      Result Value Ref Range Status   MRSA by PCR NEGATIVE  NEGATIVE Final   Comment:            The GeneXpert MRSA Assay (FDA     approved for NASAL specimens     only), is one component of a     comprehensive MRSA colonization     surveillance program. It is not     intended to diagnose MRSA     infection nor to guide or     monitor treatment for     MRSA infections.  RESPIRATORY VIRUS PANEL     Status: Abnormal   Collection Time    03/21/13  1:34 PM      Result Value Ref Range Status   Source - RVPAN NASOPHARYNGEAL   Final   Respiratory Syncytial Virus A NOT DETECTED   Final   Respiratory Syncytial Virus B NOT DETECTED   Final   Influenza A DETECTED (*)  Final   Influenza B NOT DETECTED   Final   Parainfluenza 1 NOT DETECTED   Final   Parainfluenza 2 NOT DETECTED   Final   Parainfluenza 3 NOT  DETECTED   Final   Metapneumovirus NOT DETECTED   Final   Rhinovirus NOT DETECTED   Final   Adenovirus NOT DETECTED   Final   Influenza A H1 NOT DETECTED   Final   Influenza A H3 DETECTED (*)  Final   Comment: (NOTE)           Normal Reference Range for each Analyte: NOT DETECTED     Testing performed using the Luminex xTAG Respiratory Viral Panel test     kit.     This test was developed and its performance characteristics determined     by [employer]. It has not been cleared or approved by the [place]     Food and Drug Administration. This test is used for clinical purposes.     It should not be regarded as investigational or for research. This     laboratory is certified under the [place] (CLIA) as qualified to perform high complexity     clinical laboratory testing.     Performed at [place]:  Acute respiratory failure  - Multifactorial, secondary to acute bronchitis, mild systolic heart failure.  - Continue to treat with Prednisone (but taper), nebulized bronchodilators. Initially placed on Levaquin on admission, but have stopped as Influenza PCR is positive and antibiotic therapy.Stop lasix given rapid rise in creatinine. - As noted in H&P, CT angiogram of the chest done at [facility] was negative.  -Lower ext Dopplers neg  -Echocardiogram-shows depressed EF with possible Apical thrombus (see below)  -Significantly better than on admission,lungs clear on exam, however still desaturates to 86 percent on ambulation only (O2 st fine on room air), feel that he will require a  short course of O2 while ambulating. On discharge continue with Tamiflu, tapering prednisone and albuterol inhaler  Acute systolic heart failure  - Suspect mild CHF decompensation- secondary to bronchitis/URI  - Significantly better, and now clinically compensated  -stopped lasix and Lisinopril given  ARF,Transthoracic Echo confirms moderately decreased EF with possible Apical thrombus, confirmed by a TEE done on 2/17.EF on TEE 20-25%  -continue cautiously with Coreg, please check lytes at next visit and see if ACEI can be resumed-if BP tolerates  Acute bronchitis  -Likely secondary to viral syndrome-per paper chart-influenza PCR negative at [facility] Respiratory virus panel done here is positive for Influenza A-started Tamiflu  - Continue with prednisone-but taper as above, continue albuterol inhaler in discharge   Influenza A  -as above   ARF  -secondary to Lasix.  -will resume ACEI when BP tolerates. Euvolemic on exam-no indication for Lasix  -please check lytes at next visit  Calcified Apical Thrombus  -on heprain gtt and coumadin-while inpatient, per cards no need to overlap with heparin as thrombus is calcified, therefore will just continue with Coumadin. Appt at Coumadin clinic scheduled for 2/23.  Hx of Dysphagia  - barium esophagogram on 2/17 did not show any major abnormalities-seen by GI-no further work up   Elevated troponin level  - cardiology consultation obtained, no further workup needed.  -? Demand ischemia  - Continue aspirin, statins and beta blockers  - Troponins were cycled here, they have been negative. Suspect that the troponins done at [facility] was false-positive.   HYPERTENSION  - Controlled with Coreg, resume Lisinopril when BP tolerates   History of coronary artery disease  - Continued aspirin, Coreg and statins.   TODAY-DAY OF DISCHARGE:  Subjective:   [name] today has no headache,no chest abdominal pain,no new weakness tingling or numbness, feels much better wants to go home today.   Objective:   Blood pressure 95/56, pulse 74, temperature 97.6 F (36.4 C), temperature source Oral, resp. rate 18, height _0  (1.753 m), weight 84 kg (185 lb 3 oz), SpO2 97.00%.  Intake/Output Summary (Last 24 hours) at 03/25/13  1442 Last data filed at 03/25/13 1304  Gross per 24 hour  Intake 1096.48 ml  Output    925 ml  Net 171.48 ml   Filed Weights   03/23/13 0730 03/24/13 0401 03/25/13 0447  Weight: 83.2 kg (183 lb 6.8 oz) 84.2 kg (185 lb 10 oz) 84 kg (185 lb 3 oz)    Exam Awake Alert, Oriented *3, No new F.N deficits, Normal affect [place].AT,PERRAL Supple Neck,No JVD, No cervical lymphadenopathy appriciated.  Symmetrical Chest wall movement, Good air movement bilaterally, CTAB RRR,No Gallops,Rubs or new Murmurs, No Parasternal Heave +ve B.Sounds, Abd Soft, Non tender, No organomegaly appriciated, No rebound -guarding or rigidity. No Cyanosis, Clubbing or edema, No new Rash or bruise  DISCHARGE CONDITION: Stable  DISPOSITION: Home  DISCHARGE INSTRUCTIONS:    Activity:  As tolerated   Diet recommendation: Heart Healthy diet  Discharge Orders   Future Appointments Provider Department Dept Phone   03/30/2013 2:30 PM [facility] [place] Office [phone]   03/31/2013 3:40 PM [facility] [place] Office [phone]   04/22/2013 11:05 AM [facility] [place] Office [phone]   Future Orders Complete By Expires   (HEART FAILURE PATIENTS) Call MD:  Anytime you have any of the following symptoms: 1) 3 pound weight gain in 24 hours or 5 pounds in  1 week 2) shortness of breath, with or without a dry hacking cough 3) swelling in the hands, feet or stomach 4) if you have to sleep on extra pillows at night in order to breathe.  As directed    Call MD for:  difficulty breathing, headache or visual disturbances  As directed    Call MD for:  temperature >100.4  As directed    Diet - low sodium heart healthy  As directed    Increase activity slowly  As directed       Follow-up Information   Follow up with Richardson Dopp, PA-C On 03/31/2013. (appt at 3:40 pm)    Specialty:  Physician Assistant   Contact information:   [address] [place] [address] [phone]       Follow up with [facility] On 03/30/2013. (appt at 2:30 pm)    Contact information:   [address] [place] [address]      Total Time spent on discharge equals 45 minutes.  SignedOren Binet 03/25/2013 2:42 PM

## 2013-03-25 NOTE — Progress Notes (Signed)
Subjective: No chest pain, continues to be SOB, "my breathing is still not right"  Objective: Vital signs in last 24 hours: Temp:  [96.7 F (35.9 C)-98.7 F (37.1 C)] 98.3 F (36.8 C) (02/18 0447) Pulse Rate:  [56-74] 66 (02/18 0447) Resp:  [14-24] 20 (02/18 0447) BP: (90-138)/(53-79) 90/53 mmHg (02/18 0447) SpO2:  [92 %-100 %] 92 % (02/18 0834) Weight:  [185 lb 3 oz (84 kg)] 185 lb 3 oz (84 kg) (02/18 0447) Weight change: 1 lb 12.2 oz (0.8 kg) Last BM Date: 03/23/13 Intake/Output from previous day: -158 02/17 0701 - 02/18 0700 In: 616.5 [P.O.:240; I.V.:376.5] Out: 775 [Urine:775] Intake/Output this shift: Total I/O In: -  Out: 350 [Urine:350]  PE: General:Pleasant affect, NAD Skin:Warm and dry, brisk capillary refill HEENT:normocephalic, sclera clear, mucus membranes moist Neck:supple, minimal, at 30 degress, JVD, no bruits  Heart:S1S2 RRR without murmur, gallup, rub or click Lungs: without rales,+ rhonchi and wheezes WLK:HVFM, non tender, + BS, do not palpate liver spleen or masses Ext:no lower ext edema, 2+ pedal pulses, 2+ radial pulses Neuro:alert and oriented, MAE, follows commands, + facial symmetry   Lab Results:  Recent Labs  03/24/13 0045 03/25/13 0353  WBC 16.1* 10.6*  HGB 14.4 13.8  HCT 42.4 41.4  PLT 187 150   BMET  Recent Labs  03/24/13 0045 03/25/13 0353  NA 133* 137  K 3.9 4.1  CL 92* 96  CO2 28 30  GLUCOSE 219* 148*  BUN 57* 42*  CREATININE 2.17* 1.69*  CALCIUM 8.6 8.5    Studies/Results: Dg Esophagus  03/24/2013   CLINICAL DATA:  Dysphagia for solids with forced regurgitation. History of esophageal dilatation and reflux.  EXAM: ESOPHOGRAM / BARIUM SWALLOW / BARIUM TABLET STUDY  TECHNIQUE: Combined double contrast and single contrast examination performed using effervescent crystals, thick barium liquid, and thin barium liquid. The patient was observed with fluoroscopy swallowing a 9mm barium sulphate tablet.   COMPARISON:  None.  FLUOROSCOPY TIME:  44 seconds  FINDINGS: Rapid sequence imaging of the pharynx in the AP and lateral projections demonstrates no mucosal abnormalities or laryngeal penetration. There is mild cervical spondylosis.  The esophageal motility is within normal limits aside from mild tertiary contractions. There is no evidence of esophageal ulceration, mass or fixed stricture. There is a small reducible hiatal hernia. With the water siphon test, no significant reflux was elicited. A 13 mm barium tablet passed without delay into the stomach.  IMPRESSION: 1. Small hiatal hernia without demonstrated reflux or fixed stricture. 2. A barium tablet passes without delay into the stomach. 3. Mild tertiary contractions of the esophagus.   Electronically Signed   By: Roxy Horseman M.D.   On: 03/24/2013 11:56    Medications: I have reviewed the patient's current medications. Scheduled Meds: . aspirin  162 mg Oral QHS  . carvedilol  3.125 mg Oral BID WC  . guaiFENesin  600 mg Oral BID  . ipratropium-albuterol  3 mL Nebulization BID  . niacin  500 mg Oral QHS  . oseltamivir  75 mg Oral BID  . pantoprazole  40 mg Oral Daily  . potassium chloride  20 mEq Oral Daily  . predniSONE  30 mg Oral Q breakfast  . simvastatin  10 mg Oral q1800  . sodium chloride  3 mL Intravenous Q12H  . sodium chloride  3 mL Intravenous Q12H  . Warfarin - Pharmacist Dosing Inpatient   Does not apply q1800   Continuous  Infusions: . sodium chloride    . heparin 900 Units/hr (03/24/13 1819)   PRN Meds:.sodium chloride, acetaminophen, acetaminophen, albuterol, alum & mag hydroxide-simeth, guaiFENesin-dextromethorphan, nitroGLYCERIN, ondansetron (ZOFRAN) IV, ondansetron, sodium chloride  Assessment/Plan: Principal Problem:   [facility] Active Problems:   HYPERTENSION   Coronary atherosclerosis of native coronary artery   Chronic systolic heart failure   CHF exacerbation    Acute bronchitis   Apical mural thrombus  PLAN: coumadin has been added, TEE revealed  "Impression: Suspect calcified (old) thrombus at the apex. Think it would be reasonable to start warfarin. As it appears calcified and old, do not think full heparin/warfarin overlap would be necessary"   Will need follow up with Dr. Ladona Ridgelaylor.  [facility]  Will plan for Monday.      LOS: 4 days   Time spent with pt. : 15  minutes. [facility] LLCNGOLD,LAURA R  Nurse Practitioner Certified Pager [phone] or after 5pm and on weekends call [phone] 03/25/2013, 8:40 AM   I have examined the patient and reviewed assessment and plan and discussed with patient.  Agree with above as stated.  Wil sign off.  WIll need f/u with Dr. Ladona Ridgelaylor and in our Coumadin clinic.  Let us know when he is being discharged and we can given an appt at that time.   Autum Benfer S.

## 2013-03-25 NOTE — Discharge Instructions (Signed)
Dr. Lubertha Basque office will call and give you appt time for coumadin clinic on Monday at the [facility].

## 2013-03-25 NOTE — Progress Notes (Signed)
ANTICOAGULATION CONSULT NOTE - Follow Up Consult  Pharmacy Consult for Heparin and Coumadin Indication: apical thrombus  No Known Allergies  Patient Measurements: Height: 5\' 9"  (175.3 cm) Weight: 185 lb 3 oz (84 kg) (scale c) IBW/kg (Calculated) : 70.7 Heparin Dosing Weight: 84 kg  Vital Signs: Temp: 97.6 F (36.4 C) (02/18 0954) Temp src: Oral (02/18 0447) BP: 95/56 mmHg (02/18 0954) Pulse Rate: 74 (02/18 0954)  Labs:  Recent Labs  03/23/13 1310  03/24/13 0045 03/24/13 1151 03/24/13 2238 03/25/13 0353  HGB 15.1  --  14.4  --   --  13.8  HCT 43.7  --  42.4  --   --  41.4  PLT 179  --  187  --   --  150  LABPROT  --   --   --   --  13.5 13.3  INR  --   --   --   --  1.05 1.03  HEPARINUNFRC  --   < > 1.32* 1.10* 0.46 0.55  CREATININE  --   --  2.17*  --   --  1.69*  < > = values in this interval not displayed.  Estimated Creatinine Clearance: 37.2 ml/min (by C-G formula based on Cr of 1.69).  Assessment:   Heparin level remains therapeutic (0.55) on 900 units/hr.  Coumadin begun with 7.5 mg on 2/17. INR at baseline.  Per Dr. Shirlee Latch, suspect calcified (old) apical thrombus, so may not need full 5-day overlap of heparin and Coumadin.  Goal of Therapy:  INR 2-3 Heparin level 0.3-0.7 units/ml Monitor platelets by anticoagulation protocol: Yes   Plan:   Continue heparin drip at 900 units/hr.  Repeat Coumadin 7.5 mg today.  Daily heparin level, CBC and PT/INR.  Will follow-up plans.  Dennie Fetters, [place] Pager: [phone] 03/25/2013,12:26 PM

## 2013-03-25 NOTE — Care Management Note (Addendum)
  Page 1 of 1   03/25/2013     12:08:31 PM   CARE MANAGEMENT NOTE 03/25/2013  Patient:  [name], [name]   Account Number:  [id number]  Date Initiated:  03/25/2013  Documentation initiated by:  Latiqua Daloia  Subjective/Objective Assessment:   admitted with CP/SOB - Transferr form [facility]     Action/Plan:   CM to follow for dsiposition needs   Anticipated DC Date:  03/28/2013   Anticipated DC Plan:  HOME/SELF CARE      DC Planning Services  CM consult      Choice offered to / List presented to:             Status of service:  In process, will continue to follow Medicare Important Message given?   (If response is "NO", the following Medicare IM given date fields will be blank) Date Medicare IM given:   Date Additional Medicare IM given:    Discharge Disposition:    Per UR Regulation:  Reviewed for med. necessity/level of care/duration of stay  If discussed at Long Length of Stay Meetings, dates discussed:    Comments:  03/25/2013 IV Lasix qd IV Hep/Coumadin started yesterday ADD: +2-3 Donato Schultz RN, BSN, MSHL, CCM  03/25/2013

## 2013-03-26 NOTE — Progress Notes (Signed)
UR completed  - Retro   Chianne Byrns K. Jyoti Harju, RN, BSN, MSHL, CCM  03/26/2013 4:07 PM

## 2013-03-30 ENCOUNTER — Ambulatory Visit (INDEPENDENT_AMBULATORY_CARE_PROVIDER_SITE_OTHER): Payer: Medicare Other | Admitting: *Deleted

## 2013-03-30 DIAGNOSIS — Z7901 Long term (current) use of anticoagulants: Secondary | ICD-10-CM | POA: Insufficient documentation

## 2013-03-30 DIAGNOSIS — Z5181 Encounter for therapeutic drug level monitoring: Secondary | ICD-10-CM

## 2013-03-30 DIAGNOSIS — I513 Intracardiac thrombosis, not elsewhere classified: Secondary | ICD-10-CM

## 2013-03-30 DIAGNOSIS — I2109 ST elevation (STEMI) myocardial infarction involving other coronary artery of anterior wall: Secondary | ICD-10-CM

## 2013-03-30 LAB — POCT INR: INR: 3.9

## 2013-03-30 NOTE — Patient Instructions (Signed)

## 2013-03-31 ENCOUNTER — Encounter: Payer: Medicare Other | Admitting: Physician Assistant

## 2013-04-02 ENCOUNTER — Encounter: Payer: Medicare Other | Admitting: Physician Assistant

## 2013-04-08 ENCOUNTER — Ambulatory Visit (INDEPENDENT_AMBULATORY_CARE_PROVIDER_SITE_OTHER): Payer: Medicare Other | Admitting: Physician Assistant

## 2013-04-08 ENCOUNTER — Telehealth: Payer: Self-pay | Admitting: *Deleted

## 2013-04-08 ENCOUNTER — Ambulatory Visit (INDEPENDENT_AMBULATORY_CARE_PROVIDER_SITE_OTHER): Payer: Medicare Other | Admitting: *Deleted

## 2013-04-08 ENCOUNTER — Encounter: Payer: Self-pay | Admitting: Physician Assistant

## 2013-04-08 VITALS — BP 122/72 | HR 65 | Wt 189.1 lb

## 2013-04-08 DIAGNOSIS — Z5181 Encounter for therapeutic drug level monitoring: Secondary | ICD-10-CM

## 2013-04-08 DIAGNOSIS — I472 Ventricular tachycardia, unspecified: Secondary | ICD-10-CM

## 2013-04-08 DIAGNOSIS — N182 Chronic kidney disease, stage 2 (mild): Secondary | ICD-10-CM

## 2013-04-08 DIAGNOSIS — I255 Ischemic cardiomyopathy: Secondary | ICD-10-CM

## 2013-04-08 DIAGNOSIS — I5022 Chronic systolic (congestive) heart failure: Secondary | ICD-10-CM

## 2013-04-08 DIAGNOSIS — I513 Intracardiac thrombosis, not elsewhere classified: Secondary | ICD-10-CM

## 2013-04-08 DIAGNOSIS — I4729 Other ventricular tachycardia: Secondary | ICD-10-CM

## 2013-04-08 DIAGNOSIS — I2510 Atherosclerotic heart disease of native coronary artery without angina pectoris: Secondary | ICD-10-CM

## 2013-04-08 DIAGNOSIS — I2109 ST elevation (STEMI) myocardial infarction involving other coronary artery of anterior wall: Secondary | ICD-10-CM

## 2013-04-08 DIAGNOSIS — I10 Essential (primary) hypertension: Secondary | ICD-10-CM

## 2013-04-08 DIAGNOSIS — I2589 Other forms of chronic ischemic heart disease: Secondary | ICD-10-CM

## 2013-04-08 LAB — PROTIME-INR
INR: 8.5 ratio — AB (ref 0.8–1.0)
Prothrombin Time: 86.7 s (ref 10.2–12.4)

## 2013-04-08 LAB — POCT INR: INR: 6.9

## 2013-04-08 LAB — BASIC METABOLIC PANEL
BUN: 13 mg/dL (ref 6–23)
CALCIUM: 8.9 mg/dL (ref 8.4–10.5)
CO2: 31 mEq/L (ref 19–32)
Chloride: 99 mEq/L (ref 96–112)
Creatinine, Ser: 1.3 mg/dL (ref 0.4–1.5)
GFR: 57.95 mL/min — ABNORMAL LOW (ref >60.00)
GLUCOSE: 116 mg/dL — AB (ref 70–99)
Potassium: 4.5 mEq/L (ref 3.5–5.1)
Sodium: 135 mEq/L (ref 135–145)

## 2013-04-08 MED ORDER — LISINOPRIL 20 MG PO TABS
10.0000 mg | ORAL_TABLET | Freq: Every day | ORAL | Status: AC
Start: 2013-04-08 — End: ?

## 2013-04-08 NOTE — Progress Notes (Signed)
[address] [place], [place]  [id number] Phone: [phone] Fax:  [phone]  Date:  04/08/2013   ID:  [name] NevinLarry W [name], DOB 12/25/1934, MRN [id number]  PCP:  Caroline SaugerEID,INDIA, MD  Cardiologist:  Dr. Lewayne BuntingGregg Taylor    History of Present Illness: [name] RhodesLarry W [name] is a 79 y.o. male with a hx of CAD (s/p MI in 1992 tx with POBA of LAD c/b reocclusion and recanalization, s/p stent to RCA), ischemia CM, systolic CHF, VTach s/p AICD, HTN.    He was recently admitted 2/14-2/18 with acute respiratory failure in the setting of bronchitis/influenza (influenza A+). He had mildly elevated CEs at an outside hospital. [facility]. Cardiology initially saw the patient and felt no further cardiac workup was necessary. [facility]at [facility]) was negative for pulmonary embolism. He was diuresed for suspected volume excess. He developed AKI and Lasix was stopped. Blood pressures were also low and his ACEI was held at d/c. Follow up echo was obtained. This demonstrated reduced LVF and probable apical clot. TEE confirmed calcified apical mural thrombus. It was recommended that the patient start on warfarin.  Cardiolite (01/2000):  Apical, ant, ant-septal scar, no ischemia; EF 27%.   LHC (10/2006):  pLAD 40, pRCA stent 40 ISR, dRCA 30 ISR; EF 35%.  Echo (03/23/13):  Mod LV dysfunction, Gr 1 DD, probable apical clot. TEE (03/24/13):  EF 20-25%, poss small calcified apical clot, trivial MR, grade IV plaque in aortic arch.  He is doing well since d/c.  His breathing is normal. He is NYHA Class 2.  He denies orthopnea, PND, edema.  He had some chest pressure after lying down last night.  He took some NTG that was old.  He thinks it may have helped.  He had no assoc symptoms.  No exertional chest pain.   Recent Labs: 03/21/2013: ALT 15  03/24/2013: Pro B Natriuretic peptide (BNP) 473.5*  03/25/2013: Creatinine 1.69*; Hemoglobin 13.8; Potassium 4.1   Wt Readings from Last 3 Encounters:    04/08/13 189 lb 1.9 oz (85.784 kg)  03/25/13 185 lb 3 oz (84 kg)  03/25/13 185 lb 3 oz (84 kg)     Past Medical History  Diagnosis Date  . HTN (hypertension)   . Cardiomyopathy, ischemic   . Presence of permanent cardiac pacemaker   . CHF (congestive heart failure)   . Myocardial infarction   . Apical mural thrombus 03/23/2013    Current Outpatient Prescriptions  Medication Sig Dispense Refill  . albuterol (PROVENTIL HFA;VENTOLIN HFA) 108 (90 BASE) MCG/ACT inhaler Inhale 2 puffs into the lungs every 6 (six) hours as needed for wheezing or shortness of breath.  1 Inhaler  0  . aspirin 81 MG tablet Take 162 mg by mouth at bedtime.       . carvedilol (COREG) 3.125 MG tablet Take 3.125 mg by mouth 2 (two) times daily with a meal.        . cholecalciferol (VITAMIN D) 1000 UNITS tablet Take 1,000 Units by mouth daily.      Marland Kitchen. guaiFENesin (MUCINEX) 600 MG 12 hr tablet Take 1 tablet (600 mg total) by mouth 2 (two) times daily.  10 tablet  0  . niacin (NIASPAN) 1000 MG CR tablet Take 500 mg by mouth at bedtime.       . nitroGLYCERIN (NITROSTAT) 0.4 MG SL tablet Place 0.4 mg under the tongue every 5 (five) minutes as needed.        .Marland Kitchen  omeprazole (PRILOSEC) 20 MG capsule Take 20 mg by mouth daily.      . pravastatin (PRAVACHOL) 20 MG tablet Take 20 mg by mouth daily. PCP will be changing to Lipitor in 5 weeks.      Marland Kitchen warfarin (COUMADIN) 5 MG tablet Take 7.5 mg (1.5 tablets) on 03/25/13  and 03/26/13,then Take 5 mg (1 tablet) till seen by Coumadin Clinic on 03/30/13  30 tablet  0   No current facility-administered medications for this visit.    Allergies:   Review of patient's allergies indicates no known allergies.   Social History:  The patient  reports that he quit smoking about 23 years ago. His smoking use included Cigarettes. He smoked 0.00 packs per day. He does not have any smokeless tobacco history on file. He reports that he does not drink alcohol or use illicit drugs.   Family History:   The patient's family history is not on file.   ROS:  Please see the history of present illness.      All other systems reviewed and negative.   PHYSICAL EXAM: VS:  BP 122/72  Pulse 65  Wt 189 lb 1.9 oz (85.784 kg) Well nourished, well developed, in no acute distress HEENT: normal Neck: no JVD Cardiac:  normal S1, S2; RRR; no murmur Lungs:  Decreased breath sounds bilaterally, no wheezing, rhonchi or rales Abd: soft, nontender, no hepatomegaly Ext: no edema Skin: warm and dry Neuro:  CNs 2-12 intact, no focal abnormalities noted  EKG:  NSR, HR 65, LAD, RBBB, no change from prior tracing      ASSESSMENT AND PLAN:  1. Chronic Systolic CHF:  Volume stable.  Continue current Rx. Repeat BMET today.  2. Ischemic Cardiomyopathy:  Continue beta blocker.  Will try to resume ACEI by starting back on Lisinopril at 10 mg QD. Check BMET today and repeat in 1-2 weeks.  3. CAD:  No angina.  CP last night was probably acid reflux.  Refill NTG. If he has more symptoms, he will let me know.  Consider stress testing if he has recurrent CP. Continue ASA, beta blocker, statin.  4. VTach, s/p AICD:  F/u with EP as planned.  5. LV Mural Thrombus:  Noted by echo and TEE in the hospital.  He is now on coumadin.  This is managed by the coumadin clinic. 6. Hypertension:  Controlled.  7. Hyperlipidemia:  Continue statin.   8. Chronic Kidney Disease Stage 2:  Check BMET today.  9. Disposition:  F/u with Dr. Lewayne Bunting in 06/2013.   Signed, Tereso Newcomer, PA-C  04/08/2013 2:07 PM

## 2013-04-08 NOTE — Patient Instructions (Signed)
RE-START LISINOPRIL 20 MG TABLET ; TAKE 1/2 TABLET DAILY TO = 10 MG DAILY  LAB WORK TODAY; BMET  REPEAT LAB WORK 04/17/13 DUE TO RE-STARTING LISINOPRIL  CALL IF YOUR BLOOD PRESSURE IS BELOW 100 SYSTOLIC AFTER RE-STARTING LISINOPRIL  PLEASE FOLLOW UP WITH DR. Ladona Ridgel IN MAY 2015

## 2013-04-08 NOTE — Telephone Encounter (Signed)
pt's wife notified about lab results and we will get repeat bmet 3/9 when pt comes in for CVRR appt , wife verbalized understanding to Plan of Care

## 2013-04-13 ENCOUNTER — Ambulatory Visit (INDEPENDENT_AMBULATORY_CARE_PROVIDER_SITE_OTHER): Payer: Medicare Other | Admitting: Pharmacist

## 2013-04-13 ENCOUNTER — Other Ambulatory Visit (INDEPENDENT_AMBULATORY_CARE_PROVIDER_SITE_OTHER): Payer: Medicare Other

## 2013-04-13 DIAGNOSIS — I255 Ischemic cardiomyopathy: Secondary | ICD-10-CM

## 2013-04-13 DIAGNOSIS — Z5181 Encounter for therapeutic drug level monitoring: Secondary | ICD-10-CM

## 2013-04-13 DIAGNOSIS — I513 Intracardiac thrombosis, not elsewhere classified: Secondary | ICD-10-CM

## 2013-04-13 DIAGNOSIS — I2589 Other forms of chronic ischemic heart disease: Secondary | ICD-10-CM

## 2013-04-13 DIAGNOSIS — I2109 ST elevation (STEMI) myocardial infarction involving other coronary artery of anterior wall: Secondary | ICD-10-CM

## 2013-04-13 DIAGNOSIS — I10 Essential (primary) hypertension: Secondary | ICD-10-CM

## 2013-04-13 LAB — BASIC METABOLIC PANEL WITH GFR
BUN: 13 mg/dL (ref 6–23)
CO2: 29 meq/L (ref 19–32)
Calcium: 9.4 mg/dL (ref 8.4–10.5)
Chloride: 102 meq/L (ref 96–112)
Creatinine, Ser: 1.2 mg/dL (ref 0.4–1.5)
GFR: 60.67 mL/min
Glucose, Bld: 97 mg/dL (ref 70–99)
Potassium: 4.8 meq/L (ref 3.5–5.1)
Sodium: 143 meq/L (ref 135–145)

## 2013-04-13 LAB — POCT INR: INR: 2.1

## 2013-04-13 MED ORDER — NITROGLYCERIN 0.4 MG SL SUBL: 0.4000 mg | SUBLINGUAL_TABLET | SUBLINGUAL | Status: DC | PRN

## 2013-04-17 ENCOUNTER — Other Ambulatory Visit: Payer: Medicare Other

## 2013-04-20 ENCOUNTER — Ambulatory Visit (INDEPENDENT_AMBULATORY_CARE_PROVIDER_SITE_OTHER): Payer: Medicare Other | Admitting: *Deleted

## 2013-04-20 DIAGNOSIS — Z5181 Encounter for therapeutic drug level monitoring: Secondary | ICD-10-CM

## 2013-04-20 DIAGNOSIS — I2109 ST elevation (STEMI) myocardial infarction involving other coronary artery of anterior wall: Secondary | ICD-10-CM

## 2013-04-20 DIAGNOSIS — I513 Intracardiac thrombosis, not elsewhere classified: Secondary | ICD-10-CM

## 2013-04-20 LAB — POCT INR: INR: 3.5

## 2013-04-20 MED ORDER — WARFARIN SODIUM 5 MG PO TABS: ORAL_TABLET | ORAL | Status: DC

## 2013-04-22 ENCOUNTER — Ambulatory Visit (INDEPENDENT_AMBULATORY_CARE_PROVIDER_SITE_OTHER): Payer: Medicare Other | Admitting: *Deleted

## 2013-04-22 ENCOUNTER — Encounter: Payer: Self-pay | Admitting: Internal Medicine

## 2013-04-22 DIAGNOSIS — I5022 Chronic systolic (congestive) heart failure: Secondary | ICD-10-CM

## 2013-04-22 DIAGNOSIS — I509 Heart failure, unspecified: Secondary | ICD-10-CM

## 2013-04-22 DIAGNOSIS — I2589 Other forms of chronic ischemic heart disease: Secondary | ICD-10-CM

## 2013-04-22 LAB — MDC_IDC_ENUM_SESS_TYPE_REMOTE
Battery Voltage: 2.98 V
Brady Statistic RV Percent Paced: 1 %
Date Time Interrogation Session: 20150318144208
HighPow Impedance: 52 Ohm
HighPow Impedance: 55 Ohm
Lead Channel Impedance Value: 430 Ohm
Lead Channel Pacing Threshold Amplitude: 0.75 V
Lead Channel Pacing Threshold Pulse Width: 0.5 ms
Lead Channel Setting Pacing Amplitude: 2.5 V
MDC IDC MSMT BATTERY REMAINING LONGEVITY: 85 mo
MDC IDC MSMT BATTERY REMAINING PERCENTAGE: 73 %
MDC IDC MSMT LEADCHNL RV SENSING INTR AMPL: 12 mV
MDC IDC PG SERIAL: 788888
MDC IDC SET LEADCHNL RV PACING PULSEWIDTH: 0.5 ms
MDC IDC SET LEADCHNL RV SENSING SENSITIVITY: 0.5 mV
MDC IDC SET ZONE DETECTION INTERVAL: 300 ms
Zone Setting Detection Interval: 270 ms
Zone Setting Detection Interval: 350 ms

## 2013-04-27 ENCOUNTER — Ambulatory Visit (INDEPENDENT_AMBULATORY_CARE_PROVIDER_SITE_OTHER): Payer: Medicare Other | Admitting: *Deleted

## 2013-04-27 DIAGNOSIS — Z5181 Encounter for therapeutic drug level monitoring: Secondary | ICD-10-CM

## 2013-04-27 DIAGNOSIS — I2109 ST elevation (STEMI) myocardial infarction involving other coronary artery of anterior wall: Secondary | ICD-10-CM

## 2013-04-27 DIAGNOSIS — I513 Intracardiac thrombosis, not elsewhere classified: Secondary | ICD-10-CM

## 2013-04-27 LAB — POCT INR: INR: 2.6

## 2013-05-01 ENCOUNTER — Telehealth: Payer: Self-pay | Admitting: Physician Assistant

## 2013-05-01 NOTE — Telephone Encounter (Signed)
Pt calling stating he has not used his O2 since he was d/c from hospital and was wanting to know if we could call [facility] and ask them to pick up the equipment. Spoke w/Scott Weaver,PA who suggested that the Home Care nurse do an oxygen saturation and if that is OK can remove the equipment. Spoke w/Erica at [facility] at [phone] who spoke with Respiratory and will send nurse out first of week to do O2 sat. and will call or fax results to [place].  Gave her our phone number and fax.  Advised to send to [facility].

## 2013-05-01 NOTE — Telephone Encounter (Signed)
New message     Pt no longer is using his oxygen.  Please call [facility] to come and pick it up.  They tried to call but we need to call.

## 2013-05-04 NOTE — Progress Notes (Signed)
ICD remote with ICM 

## 2013-05-05 ENCOUNTER — Encounter: Payer: Self-pay | Admitting: *Deleted

## 2013-05-11 ENCOUNTER — Ambulatory Visit (INDEPENDENT_AMBULATORY_CARE_PROVIDER_SITE_OTHER): Payer: Medicare Other | Admitting: *Deleted

## 2013-05-11 DIAGNOSIS — I513 Intracardiac thrombosis, not elsewhere classified: Secondary | ICD-10-CM

## 2013-05-11 DIAGNOSIS — Z5181 Encounter for therapeutic drug level monitoring: Secondary | ICD-10-CM

## 2013-05-11 DIAGNOSIS — I2109 ST elevation (STEMI) myocardial infarction involving other coronary artery of anterior wall: Secondary | ICD-10-CM

## 2013-05-11 LAB — POCT INR: INR: 4.3

## 2013-05-21 ENCOUNTER — Ambulatory Visit (INDEPENDENT_AMBULATORY_CARE_PROVIDER_SITE_OTHER): Payer: Medicare Other | Admitting: Pharmacist Clinician (PhC)/ Clinical Pharmacy Specialist

## 2013-05-21 DIAGNOSIS — Z5181 Encounter for therapeutic drug level monitoring: Secondary | ICD-10-CM

## 2013-05-21 DIAGNOSIS — I2109 ST elevation (STEMI) myocardial infarction involving other coronary artery of anterior wall: Secondary | ICD-10-CM

## 2013-05-21 DIAGNOSIS — I513 Intracardiac thrombosis, not elsewhere classified: Secondary | ICD-10-CM

## 2013-05-21 LAB — POCT INR: INR: 3.8

## 2013-06-04 ENCOUNTER — Ambulatory Visit (INDEPENDENT_AMBULATORY_CARE_PROVIDER_SITE_OTHER): Payer: Medicare Other | Admitting: Pharmacist Clinician (PhC)/ Clinical Pharmacy Specialist

## 2013-06-04 DIAGNOSIS — I2109 ST elevation (STEMI) myocardial infarction involving other coronary artery of anterior wall: Secondary | ICD-10-CM

## 2013-06-04 DIAGNOSIS — Z5181 Encounter for therapeutic drug level monitoring: Secondary | ICD-10-CM

## 2013-06-04 DIAGNOSIS — I513 Intracardiac thrombosis, not elsewhere classified: Secondary | ICD-10-CM

## 2013-06-04 LAB — POCT INR: INR: 2.8

## 2013-06-23 ENCOUNTER — Ambulatory Visit (INDEPENDENT_AMBULATORY_CARE_PROVIDER_SITE_OTHER): Payer: Medicare Other | Admitting: *Deleted

## 2013-06-23 ENCOUNTER — Encounter: Payer: Self-pay | Admitting: Internal Medicine

## 2013-06-23 ENCOUNTER — Ambulatory Visit (INDEPENDENT_AMBULATORY_CARE_PROVIDER_SITE_OTHER): Payer: Medicare Other | Admitting: Internal Medicine

## 2013-06-23 VITALS — BP 146/90 | HR 65 | Ht 69.0 in | Wt 194.1 lb

## 2013-06-23 DIAGNOSIS — I2589 Other forms of chronic ischemic heart disease: Secondary | ICD-10-CM

## 2013-06-23 DIAGNOSIS — I513 Intracardiac thrombosis, not elsewhere classified: Secondary | ICD-10-CM

## 2013-06-23 DIAGNOSIS — I472 Ventricular tachycardia, unspecified: Secondary | ICD-10-CM

## 2013-06-23 DIAGNOSIS — I5022 Chronic systolic (congestive) heart failure: Secondary | ICD-10-CM

## 2013-06-23 DIAGNOSIS — I4729 Other ventricular tachycardia: Secondary | ICD-10-CM

## 2013-06-23 DIAGNOSIS — Z95810 Presence of automatic (implantable) cardiac defibrillator: Secondary | ICD-10-CM

## 2013-06-23 DIAGNOSIS — Z5181 Encounter for therapeutic drug level monitoring: Secondary | ICD-10-CM

## 2013-06-23 DIAGNOSIS — I2109 ST elevation (STEMI) myocardial infarction involving other coronary artery of anterior wall: Secondary | ICD-10-CM

## 2013-06-23 LAB — POCT INR: INR: 2.9

## 2013-06-23 NOTE — Assessment & Plan Note (Signed)
His St. Jude ICD is working normally. Will recheck in several months.  

## 2013-06-23 NOTE — Assessment & Plan Note (Signed)
He denies anginal symptoms. No change in medical therapy. 

## 2013-06-23 NOTE — Patient Instructions (Signed)
Your physician wants you to follow-up in: 12 months with Dr Court Joy will receive a reminder letter in the mail two months in advance. If you don't receive a letter, please call our office to schedule the follow-up appointment.  Remote monitoring is used to monitor your Pacemaker of ICD from home. This monitoring reduces the number of office visits required to check your device to one time per year. It allows [place] to keep an eye on the functioning of your device to ensure it is working properly. You are scheduled for a device check from home on 09/24/13. You may send your transmission at any time that day. If you have a wireless device, the transmission will be sent automatically. After your physician reviews your transmission, you will receive a postcard with your next transmission date.   Okay to return Oxygen

## 2013-06-23 NOTE — Progress Notes (Signed)
HPI Mr. Mayford KnifeWilliams returns today after a 2 year absence from our ICD clinic. He is a pleasant 79 yo man with an ICM, chronic systolic heart failure and is s/p ICD implant. In the interim, he has done well. He notes that he had influenza and was in the hospital. He was placed on oxygen. He has now come off of oxygen. He has walked in our office today and noted to have oxygen sat of 95% while walking vigorously.  No Known Allergies   Current Outpatient Prescriptions  Medication Sig Dispense Refill  . aspirin 81 MG tablet Take 162 mg by mouth at bedtime.       Marland Kitchen. atorvastatin (LIPITOR) 20 MG tablet Take 20 mg by mouth daily.      . carvedilol (COREG) 3.125 MG tablet Take 3.125 mg by mouth 2 (two) times daily with a meal.        . cholecalciferol (VITAMIN D) 1000 UNITS tablet Take 1,000 Units by mouth daily.      Marland Kitchen. lisinopril (PRINIVIL,ZESTRIL) 20 MG tablet Take 0.5 tablets (10 mg total) by mouth daily.      . niacin (NIASPAN) 1000 MG CR tablet Take 500 mg by mouth at bedtime.       . nitroGLYCERIN (NITROSTAT) 0.4 MG SL tablet Place 1 tablet (0.4 mg total) under the tongue every 5 (five) minutes as needed.  30 tablet  1  . omeprazole (PRILOSEC) 20 MG capsule Take 20 mg by mouth daily.      Marland Kitchen. warfarin (COUMADIN) 5 MG tablet Take as directed by coumadin clinic  30 tablet  3   No current facility-administered medications for this visit.     Past Medical History  Diagnosis Date  . HTN (hypertension)   . Cardiomyopathy, ischemic   . Presence of permanent cardiac pacemaker   . CHF (congestive heart failure)   . Myocardial infarction   . Apical mural thrombus 03/23/2013    ROS:   All systems reviewed and negative except as noted in the HPI.   Past Surgical History  Procedure Laterality Date  . Angioplasty / stenting femoral    . Cardiac defibrillator placement      ICD-St Jude  . Tee without cardioversion N/A 03/24/2013    Procedure: TRANSESOPHAGEAL ECHOCARDIOGRAM (TEE);  Surgeon:  Laurey Moralealton S McLean, MD;  Location: [facility];  Service: Cardiovascular;  Laterality: N/A;     No family history on file.   History   Social History  . Marital Status: Married    Spouse Name: N/A    Number of Children: N/A  . Years of Education: N/A   Occupational History  . Not on file.   Social History Main Topics  . Smoking status: Former Smoker    Types: Cigarettes    Quit date: 03/21/1990  . Smokeless tobacco: Not on file  . Alcohol Use: No  . Drug Use: No  . Sexual Activity: Not on file   Other Topics Concern  . Not on file   Social History Narrative  . No narrative on file     BP 146/90  Pulse 65  Ht 5\' 9"  (1.753 m)  Wt 194 lb 1.9 oz (88.052 kg)  BMI 28.65 kg/m2  Physical Exam:  Well appearing NAD HEENT: Unremarkable Neck:  No JVD, no thyromegally Back:  No CVA tenderness Lungs:  Clear with no wheezes HEART:  Regular rate rhythm, no murmurs, no rubs, no clicks Abd:  soft, positive bowel sounds, no  organomegally, no rebound, no guarding Ext:  2 plus pulses, no edema, no cyanosis, no clubbing Skin:  No rashes no nodules Neuro:  CN II through XII intact, motor grossly intact  DEVICE  Normal device function.  See PaceArt for details.   Assess/Plan:

## 2013-06-23 NOTE — Assessment & Plan Note (Signed)
His heart failure symptoms are well compensated. 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 He will continue his current meds.

## 2013-06-24 ENCOUNTER — Encounter: Payer: Self-pay | Admitting: Internal Medicine

## 2013-07-07 ENCOUNTER — Telehealth: Payer: Self-pay | Admitting: Internal Medicine

## 2013-07-07 NOTE — Telephone Encounter (Signed)
Pt's wife called in, she is very concerned about her husband. He is coughing ( hard ) with lots of congestion. She wants to know what he can take since he is on coumadin. Please call and advise.

## 2013-07-07 NOTE — Telephone Encounter (Signed)
Spoke width wife after discussing with Dr Ladona Ridgel.  I have asked her to contact his PCP to been seen for medications

## 2013-07-21 ENCOUNTER — Ambulatory Visit (INDEPENDENT_AMBULATORY_CARE_PROVIDER_SITE_OTHER): Payer: Medicare Other

## 2013-07-21 DIAGNOSIS — I2109 ST elevation (STEMI) myocardial infarction involving other coronary artery of anterior wall: Secondary | ICD-10-CM

## 2013-07-21 DIAGNOSIS — I513 Intracardiac thrombosis, not elsewhere classified: Secondary | ICD-10-CM

## 2013-07-21 DIAGNOSIS — Z5181 Encounter for therapeutic drug level monitoring: Secondary | ICD-10-CM

## 2013-07-21 LAB — POCT INR: INR: 4.1

## 2013-08-04 ENCOUNTER — Ambulatory Visit (INDEPENDENT_AMBULATORY_CARE_PROVIDER_SITE_OTHER): Payer: Medicare Other | Admitting: *Deleted

## 2013-08-04 DIAGNOSIS — Z5181 Encounter for therapeutic drug level monitoring: Secondary | ICD-10-CM

## 2013-08-04 DIAGNOSIS — I2109 ST elevation (STEMI) myocardial infarction involving other coronary artery of anterior wall: Secondary | ICD-10-CM

## 2013-08-04 DIAGNOSIS — I513 Intracardiac thrombosis, not elsewhere classified: Secondary | ICD-10-CM

## 2013-08-04 LAB — POCT INR: INR: 2.8

## 2013-08-25 ENCOUNTER — Ambulatory Visit (INDEPENDENT_AMBULATORY_CARE_PROVIDER_SITE_OTHER): Payer: Medicare Other | Admitting: *Deleted

## 2013-08-25 DIAGNOSIS — Z5181 Encounter for therapeutic drug level monitoring: Secondary | ICD-10-CM

## 2013-08-25 DIAGNOSIS — I2109 ST elevation (STEMI) myocardial infarction involving other coronary artery of anterior wall: Secondary | ICD-10-CM

## 2013-08-25 DIAGNOSIS — I513 Intracardiac thrombosis, not elsewhere classified: Secondary | ICD-10-CM

## 2013-08-25 LAB — POCT INR: INR: 2.5

## 2013-09-22 ENCOUNTER — Ambulatory Visit (INDEPENDENT_AMBULATORY_CARE_PROVIDER_SITE_OTHER): Payer: Medicare Other | Admitting: *Deleted

## 2013-09-22 DIAGNOSIS — I513 Intracardiac thrombosis, not elsewhere classified: Secondary | ICD-10-CM

## 2013-09-22 DIAGNOSIS — I2109 ST elevation (STEMI) myocardial infarction involving other coronary artery of anterior wall: Secondary | ICD-10-CM

## 2013-09-22 DIAGNOSIS — Z5181 Encounter for therapeutic drug level monitoring: Secondary | ICD-10-CM

## 2013-09-22 LAB — POCT INR: INR: 2.8

## 2013-09-24 ENCOUNTER — Ambulatory Visit (INDEPENDENT_AMBULATORY_CARE_PROVIDER_SITE_OTHER): Payer: Medicare Other | Admitting: *Deleted

## 2013-09-24 DIAGNOSIS — I2589 Other forms of chronic ischemic heart disease: Secondary | ICD-10-CM

## 2013-09-24 DIAGNOSIS — I5022 Chronic systolic (congestive) heart failure: Secondary | ICD-10-CM

## 2013-09-24 NOTE — Progress Notes (Signed)
Remote ICD transmission.   

## 2013-09-28 LAB — MDC_IDC_ENUM_SESS_TYPE_REMOTE
Battery Remaining Longevity: 80 mo
Battery Remaining Percentage: 69 %
Brady Statistic RV Percent Paced: 1 %
HIGH POWER IMPEDANCE MEASURED VALUE: 51 Ohm
Lead Channel Sensing Intrinsic Amplitude: 12 mV
Lead Channel Setting Pacing Pulse Width: 0.5 ms
Lead Channel Setting Sensing Sensitivity: 0.5 mV
MDC IDC MSMT BATTERY VOLTAGE: 2.98 V
MDC IDC MSMT LEADCHNL RV IMPEDANCE VALUE: 430 Ohm
MDC IDC PG SERIAL: 788888
MDC IDC SESS DTM: 20150820072213
MDC IDC SET LEADCHNL RV PACING AMPLITUDE: 2.5 V
MDC IDC SET ZONE DETECTION INTERVAL: 300 ms
Zone Setting Detection Interval: 270 ms
Zone Setting Detection Interval: 350 ms

## 2013-10-01 ENCOUNTER — Encounter: Payer: Self-pay | Admitting: Cardiology

## 2013-10-06 ENCOUNTER — Telehealth: Payer: Self-pay | Admitting: Internal Medicine

## 2013-10-06 NOTE — Telephone Encounter (Signed)
Spoke with patient's wife.  He fell one week ago and is still having rib pain of which [facility] told [facility] could last for another month.  I have asked that they send a transmission so we can make sure his device is working properly  Dr Ladona Ridgel aware and says as long as transmission is good just will keep follow up as currently scheduled

## 2013-10-06 NOTE — Telephone Encounter (Signed)
New problem:   Per pt's wife pt was seen at the [place] today and was told to let Dr Ladona Ridgel Know of fall pt had and the pain he is in.    Please give pt a call back.

## 2013-10-07 NOTE — Telephone Encounter (Signed)
I asked [name] if she had attempted to send in pt's remote since we have yet to receive the info. She stated that she had not since she had been busy today. I asked her id she could send some time tonight. She stated that she would, so we could have the info for GT tomorrow.

## 2013-10-08 ENCOUNTER — Encounter: Payer: Self-pay | Admitting: Internal Medicine

## 2013-10-09 NOTE — Telephone Encounter (Signed)
Informed Zonia that device function appears to WNL.

## 2013-10-27 ENCOUNTER — Ambulatory Visit (INDEPENDENT_AMBULATORY_CARE_PROVIDER_SITE_OTHER): Payer: Medicare Other | Admitting: Pharmacist

## 2013-10-27 DIAGNOSIS — I2109 ST elevation (STEMI) myocardial infarction involving other coronary artery of anterior wall: Secondary | ICD-10-CM

## 2013-10-27 DIAGNOSIS — Z5181 Encounter for therapeutic drug level monitoring: Secondary | ICD-10-CM

## 2013-10-27 DIAGNOSIS — I513 Intracardiac thrombosis, not elsewhere classified: Secondary | ICD-10-CM

## 2013-10-27 LAB — POCT INR: INR: 3.3

## 2013-10-27 MED ORDER — WARFARIN SODIUM 5 MG PO TABS: ORAL_TABLET | ORAL | Status: DC

## 2013-11-19 ENCOUNTER — Ambulatory Visit (INDEPENDENT_AMBULATORY_CARE_PROVIDER_SITE_OTHER): Payer: Medicare Other | Admitting: Pharmacist

## 2013-11-19 DIAGNOSIS — Z5181 Encounter for therapeutic drug level monitoring: Secondary | ICD-10-CM

## 2013-11-19 DIAGNOSIS — I513 Intracardiac thrombosis, not elsewhere classified: Secondary | ICD-10-CM

## 2013-11-19 DIAGNOSIS — I2109 ST elevation (STEMI) myocardial infarction involving other coronary artery of anterior wall: Secondary | ICD-10-CM

## 2013-11-19 LAB — POCT INR: INR: 3.2

## 2013-12-10 ENCOUNTER — Ambulatory Visit (INDEPENDENT_AMBULATORY_CARE_PROVIDER_SITE_OTHER): Payer: Medicare Other

## 2013-12-10 DIAGNOSIS — Z5181 Encounter for therapeutic drug level monitoring: Secondary | ICD-10-CM

## 2013-12-10 DIAGNOSIS — I2109 ST elevation (STEMI) myocardial infarction involving other coronary artery of anterior wall: Secondary | ICD-10-CM

## 2013-12-10 DIAGNOSIS — I513 Intracardiac thrombosis, not elsewhere classified: Secondary | ICD-10-CM

## 2013-12-10 LAB — POCT INR: INR: 2.2

## 2013-12-30 ENCOUNTER — Ambulatory Visit (INDEPENDENT_AMBULATORY_CARE_PROVIDER_SITE_OTHER): Payer: Medicare Other | Admitting: *Deleted

## 2013-12-30 ENCOUNTER — Encounter: Payer: Self-pay | Admitting: Internal Medicine

## 2013-12-30 DIAGNOSIS — I255 Ischemic cardiomyopathy: Secondary | ICD-10-CM

## 2013-12-30 DIAGNOSIS — I5022 Chronic systolic (congestive) heart failure: Secondary | ICD-10-CM

## 2013-12-30 NOTE — Progress Notes (Signed)
Remote ICD transmission.   

## 2014-01-02 LAB — MDC_IDC_ENUM_SESS_TYPE_REMOTE
Brady Statistic RV Percent Paced: 1 %
HIGH POWER IMPEDANCE MEASURED VALUE: 52 Ohm
Implantable Pulse Generator Serial Number: 788888
Lead Channel Impedance Value: 400 Ohm
Lead Channel Pacing Threshold Amplitude: 0.75 V
Lead Channel Pacing Threshold Pulse Width: 0.5 ms
Lead Channel Setting Pacing Amplitude: 2.5 V
Lead Channel Setting Pacing Pulse Width: 0.5 ms
Lead Channel Setting Sensing Sensitivity: 0.5 mV
MDC IDC MSMT BATTERY REMAINING LONGEVITY: 79 mo
MDC IDC MSMT BATTERY REMAINING PERCENTAGE: 68 %
MDC IDC MSMT BATTERY VOLTAGE: 2.98 V
MDC IDC MSMT LEADCHNL RV SENSING INTR AMPL: 12 mV
MDC IDC SESS DTM: 20151125095228
MDC IDC SET ZONE DETECTION INTERVAL: 270 ms
MDC IDC SET ZONE DETECTION INTERVAL: 300 ms
MDC IDC SET ZONE DETECTION INTERVAL: 350 ms

## 2014-01-07 ENCOUNTER — Ambulatory Visit (INDEPENDENT_AMBULATORY_CARE_PROVIDER_SITE_OTHER): Payer: Medicare Other | Admitting: Pharmacist

## 2014-01-07 DIAGNOSIS — I2109 ST elevation (STEMI) myocardial infarction involving other coronary artery of anterior wall: Secondary | ICD-10-CM

## 2014-01-07 DIAGNOSIS — Z5181 Encounter for therapeutic drug level monitoring: Secondary | ICD-10-CM

## 2014-01-07 DIAGNOSIS — I513 Intracardiac thrombosis, not elsewhere classified: Secondary | ICD-10-CM

## 2014-01-07 LAB — POCT INR: INR: 2.2

## 2014-01-20 ENCOUNTER — Encounter: Payer: Self-pay | Admitting: Cardiology

## 2014-02-04 ENCOUNTER — Ambulatory Visit (INDEPENDENT_AMBULATORY_CARE_PROVIDER_SITE_OTHER): Payer: Medicare Other | Admitting: Pharmacist

## 2014-02-04 DIAGNOSIS — I513 Intracardiac thrombosis, not elsewhere classified: Secondary | ICD-10-CM

## 2014-02-04 DIAGNOSIS — I2109 ST elevation (STEMI) myocardial infarction involving other coronary artery of anterior wall: Secondary | ICD-10-CM

## 2014-02-04 DIAGNOSIS — Z5181 Encounter for therapeutic drug level monitoring: Secondary | ICD-10-CM

## 2014-02-04 LAB — POCT INR: INR: 2

## 2014-03-08 ENCOUNTER — Telehealth: Payer: Self-pay | Admitting: Internal Medicine

## 2014-03-08 NOTE — Telephone Encounter (Signed)
Spoke with the patient's wife and he has been becoming more and more SOB so much so that today while going with her to [employer] he had to sit down once he entered the store from the parking lot.  They were parked close to the front.  It has gotten significantly worse since Saturday.  Weight is the same but breathing is worse.  She would feel better if he is seen.  I offered tomorrow with Tereso Newcomer PA at 11:30. He will take that

## 2014-03-08 NOTE — Telephone Encounter (Signed)
New message     Pt c/o Shortness Of Breath: STAT if SOB developed within the last 24 hours or pt is noticeably SOB on the phone  1. Are you currently SOB (can you hear that pt is SOB on the phone)?  Per wife, pt is currently sob  2. How long have you been experiencing SOB? Since saturday 3. Are you SOB when sitting or when up moving around? Walking a short distance   4. Are you currently experiencing any other symptoms? no

## 2014-03-09 ENCOUNTER — Encounter: Payer: Self-pay | Admitting: Physician Assistant

## 2014-03-09 ENCOUNTER — Telehealth: Payer: Self-pay | Admitting: *Deleted

## 2014-03-09 ENCOUNTER — Ambulatory Visit
Admission: RE | Admit: 2014-03-09 | Discharge: 2014-03-09 | Disposition: A | Discharge status: Home / Self Care | Payer: Medicare Other | Source: Ambulatory Visit | Attending: Physician Assistant | Admitting: Physician Assistant

## 2014-03-09 ENCOUNTER — Ambulatory Visit (INDEPENDENT_AMBULATORY_CARE_PROVIDER_SITE_OTHER): Payer: Medicare Other | Admitting: Physician Assistant

## 2014-03-09 ENCOUNTER — Encounter: Payer: Medicare Other | Admitting: *Deleted

## 2014-03-09 VITALS — BP 142/80 | HR 76 | Ht 69.0 in | Wt 196.8 lb

## 2014-03-09 DIAGNOSIS — I513 Intracardiac thrombosis, not elsewhere classified: Secondary | ICD-10-CM

## 2014-03-09 DIAGNOSIS — I5023 Acute on chronic systolic (congestive) heart failure: Secondary | ICD-10-CM

## 2014-03-09 DIAGNOSIS — I255 Ischemic cardiomyopathy: Secondary | ICD-10-CM

## 2014-03-09 DIAGNOSIS — N182 Chronic kidney disease, stage 2 (mild): Secondary | ICD-10-CM

## 2014-03-09 DIAGNOSIS — I472 Ventricular tachycardia, unspecified: Secondary | ICD-10-CM

## 2014-03-09 DIAGNOSIS — I2510 Atherosclerotic heart disease of native coronary artery without angina pectoris: Secondary | ICD-10-CM

## 2014-03-09 DIAGNOSIS — I2109 ST elevation (STEMI) myocardial infarction involving other coronary artery of anterior wall: Secondary | ICD-10-CM

## 2014-03-09 DIAGNOSIS — I10 Essential (primary) hypertension: Secondary | ICD-10-CM

## 2014-03-09 DIAGNOSIS — E785 Hyperlipidemia, unspecified: Secondary | ICD-10-CM

## 2014-03-09 DIAGNOSIS — R0602 Shortness of breath: Secondary | ICD-10-CM

## 2014-03-09 LAB — CBC WITH DIFFERENTIAL/PLATELET
BASOS ABS: 0 10*3/uL (ref 0.0–0.1)
Basophils Relative: 0.4 % (ref 0.0–3.0)
Eosinophils Absolute: 0.5 10*3/uL (ref 0.0–0.7)
Eosinophils Relative: 5.6 % — ABNORMAL HIGH (ref 0.0–5.0)
HEMATOCRIT: 44.4 % (ref 39.0–52.0)
HEMOGLOBIN: 14.8 g/dL (ref 13.0–17.0)
LYMPHS ABS: 2.7 10*3/uL (ref 0.7–4.0)
Lymphocytes Relative: 29.7 % (ref 12.0–46.0)
MCHC: 33.5 g/dL (ref 30.0–36.0)
MCV: 90.1 fl (ref 78.0–100.0)
MONOS PCT: 11.4 % (ref 3.0–12.0)
Monocytes Absolute: 1.1 10*3/uL — ABNORMAL HIGH (ref 0.1–1.0)
NEUTROS ABS: 4.9 10*3/uL (ref 1.4–7.7)
Neutrophils Relative %: 52.9 % (ref 43.0–77.0)
Platelets: 168 10*3/uL (ref 150.0–400.0)
RBC: 4.92 Mil/uL (ref 4.22–5.81)
RDW: 13.8 % (ref 11.5–15.5)
WBC: 9.2 10*3/uL (ref 4.0–10.5)

## 2014-03-09 LAB — TSH: TSH: 3.56 u[IU]/mL (ref 0.35–4.50)

## 2014-03-09 LAB — MDC_IDC_ENUM_SESS_TYPE_INCLINIC
Lead Channel Setting Pacing Pulse Width: 0.5 ms
Lead Channel Setting Sensing Sensitivity: 0.5 mV
MDC IDC PG SERIAL: 788888
MDC IDC SET LEADCHNL RV PACING AMPLITUDE: 2.5 V
MDC IDC SET ZONE DETECTION INTERVAL: 270 ms
Zone Setting Detection Interval: 300 ms
Zone Setting Detection Interval: 350 ms

## 2014-03-09 LAB — BRAIN NATRIURETIC PEPTIDE: PRO B NATRI PEPTIDE: 476 pg/mL — AB (ref 0.0–100.0)

## 2014-03-09 LAB — BASIC METABOLIC PANEL
BUN: 13 mg/dL (ref 6–23)
CHLORIDE: 99 meq/L (ref 96–112)
CO2: 33 mEq/L — ABNORMAL HIGH (ref 19–32)
Calcium: 9.2 mg/dL (ref 8.4–10.5)
Creatinine, Ser: 1.21 mg/dL (ref 0.40–1.50)
GFR: 61.68 mL/min (ref >60.00)
GLUCOSE: 136 mg/dL — AB (ref 70–99)
Potassium: 4.8 mEq/L (ref 3.5–5.1)
Sodium: 136 mEq/L (ref 135–145)

## 2014-03-09 MED ORDER — FUROSEMIDE 20 MG PO TABS: 20.0000 mg | ORAL_TABLET | Freq: Every day | ORAL | Status: DC

## 2014-03-09 NOTE — Progress Notes (Addendum)
Cardiology Office Note   Date:  03/09/2014   ID:  [name], [name] Jun 30, 1934, MRN [id number]  PCP:  Caroline Sauger, MD  Cardiologist/Electrophysiologist:  Dr. Lewayne Bunting   Chief Complaint  Patient presents with  . Shortness of Breath  . Congestive Heart Failure  . Coronary Artery Disease     History of Present Illness: [name] is a 80 y.o. male with a hx of CAD (s/p MI in 1992 tx with POBA of LAD c/b reocclusion and recanalization, s/p stent to RCA), ischemic CM, systolic CHF, VTach s/p AICD, HTN.   Admitted 03/2013 with acute respiratory failure in the setting of bronchitis/influenza (influenza A+). He had mildly elevated CEs at an outside hospital.  Chest CTA (at [facility]) was negative for pulmonary embolism. Echo demonstrated reduced LVF and probable apical clot that was confirmed by TEE and he was started on warfarin.  Over the last week, he has started to notice increased DOE.  He describes NYHA 2b-3 symptoms.  He denies orthopnea, PND.  He denies edema.  He denies chest discomfort, arm or jaw discomfort.  He does note a cough.  This is productive mainly of clear sputum.  He denies hemoptysis.  He denies fevers or chills.  He does note a 4 lb weight gain at home over the past 2 weeks.    Studies/Reports Reviewed Today:   - Cardiolite (01/2000): Apical, ant, ant-septal scar, no ischemia; EF 27%.   - LHC (10/2006): pLAD 40, pRCA stent 40 ISR, dRCA 30 ISR; EF 35%.   - Echo (03/23/13): Mod LV dysfunction, Gr 1 DD, probable apical clot.  - TEE (03/24/13): EF 20-25%, poss small calcified apical clot, trivial MR, grade IV plaque in aortic arch.    Past Medical History  Diagnosis Date  . HTN (hypertension)   . Ischemic cardiomyopathy     EF 20-25%  . Presence of permanent cardiac pacemaker   . Chronic systolic CHF (congestive heart failure)   . CAD (coronary artery disease)     a. s/p  MI in 1992 tx with POBA of LAD c/b reocclusion and recanalization; s/p  stent to RCA;  b. Cardiolite (01/2000): Apical, ant, ant-septal scar, no ischemia; EF 27%;  c. LHC (10/2006): pLAD 40, pRCA stent 40 ISR, dRCA 30 ISR; EF 35%   . Apical mural thrombus 03/23/2013    a. Echo (03/23/13): Mod LV dysfunction, Gr 1 DD, probable apical clot;  b. TEE (03/24/13): EF 20-25%, poss small calcified apical clot, trivial MR, grade IV plaque in aortic arch   . Ventricular tachycardia   . AICD (automatic cardioverter/defibrillator) present   . HLD (hyperlipidemia)   . CKD (chronic kidney disease)     Past Surgical History  Procedure Laterality Date  . Angioplasty / stenting femoral    . Cardiac defibrillator placement      ICD-St Jude  . Tee without cardioversion N/A 03/24/2013    Procedure: TRANSESOPHAGEAL ECHOCARDIOGRAM (TEE);  Surgeon: Laurey Morale, MD;  Location: [facility];  Service: Cardiovascular;  Laterality: N/A;     Current Outpatient Prescriptions  Medication Sig Dispense Refill  . aspirin 81 MG tablet Take 162 mg by mouth at bedtime.     Marland Kitchen atorvastatin (LIPITOR) 20 MG tablet Take 20 mg by mouth daily.    . carvedilol (COREG) 3.125 MG tablet Take 3.125 mg by mouth 2 (two) times daily with a meal.      . cholecalciferol (VITAMIN D) 1000 UNITS tablet Take 1,000 Units by  mouth daily.    Marland Kitchen lisinopril (PRINIVIL,ZESTRIL) 20 MG tablet Take 0.5 tablets (10 mg total) by mouth daily.    . niacin (NIASPAN) 1000 MG CR tablet Take 1,000 mg by mouth at bedtime.     . nitroGLYCERIN (NITROSTAT) 0.4 MG SL tablet Place 1 tablet (0.4 mg total) under the tongue every 5 (five) minutes as needed. 30 tablet 1  . omeprazole (PRILOSEC) 20 MG capsule Take 10 mg by mouth daily.     Marland Kitchen warfarin (COUMADIN) 5 MG tablet Take as directed by coumadin clinic 30 tablet 3   No current facility-administered medications for this visit.    Allergies:   Review of patient's allergies indicates no known allergies.    Social History:  The patient  reports that he quit smoking about 23 years  ago. His smoking use included Cigarettes. He does not have any smokeless tobacco history on file. He reports that he does not drink alcohol or use illicit drugs.   Family History:  The patient's family history includes AAA (abdominal aortic aneurysm) in his father; Diabetes in his mother.    ROS:  Please see the history of present illness.   Otherwise, review of systems are positive for fatigue, bleeds easily, snoring, witnessed apnea, frequent naps.   All other systems are reviewed and negative.    PHYSICAL EXAM: VS:  BP 142/80 mmHg  Pulse 76  Ht  (1.753 m)  Wt 196 lb 12.8 oz (89.268 kg)  BMI 29.05 kg/m2    Wt Readings from Last 3 Encounters:  03/09/14 196 lb 12.8 oz (89.268 kg)  06/23/13 194 lb 1.9 oz (88.052 kg)  04/08/13 189 lb 1.9 oz (85.784 kg)     GEN: Well nourished, well developed, in no acute distress HEENT: normal Neck: no JVD, no carotid bruits, no masses Cardiac:  Normal S1/S2, RRR; no murmur, no rubs or gallops, no edema  Respiratory:  Decreased breath sounds bilaterally, no wheezing, rhonchi or rales. GI: soft, nontender, nondistended, + BS MS: no deformity or atrophy Skin: warm and dry  Neuro:  CNs II-XII intact, Strength and sensation are intact Psych: Normal affect   EKG:  EKG is ordered today.  It demonstrates:   NSR, HR 76, LBBB, no change from prior tracing.  Device Interrogation:  CoreVue demonstrates decreased thoracic impedance indicating volume excess.   Recent Labs: 03/21/2013: ALT 15 03/24/2013: Pro B Natriuretic peptide (BNP) 473.5* 03/25/2013: Hemoglobin 13.8; Platelets 150 04/13/2013: BUN 13; Creatinine 1.2; Potassium 4.8; Sodium 143    Recent Labs  11/19/13 1150 12/10/13 1306 01/07/14 1301 02/04/14 1229  INR 3.2 2.2 2.2 2.0     Lipid Panel No results found for: CHOL, TRIG, HDL, CHOLHDL, VLDL, LDLCALC, LDLDIRECT    ASSESSMENT AND PLAN:  1.  Acute on Chronic Systolic CHF:  He notes shortness of breath worse over the past week.   He has not had this since he had his last PCI many years ago.  However, his CoreVue indicates that he is volume overloaded.  I will treat him initially for volume overload.    -  Labs today:  BMET, BNP, CBC, TSH.    -  Start Lasix 20 mg daily.      -  Repeat BMET in 1 week.    -  Early FU in 1 week.  Will plan on repeating CoreVue measurement at that time. 2.  Ischemic Cardiomyopathy:  EF 20-25% by echo last year.  Continue Coreg, Lisinopril.  3.  Coronary Artery  Disease:  At this point, I believe his dyspnea is related to volume excess. If his Corevue returns to baseline but his symptoms remain unchanged, he will need stress testing.  I would also consider adding nitrates for medical therapy.      -  Continue ASA, Lipitor 20, Coreg 3.125 bid, Lisinopril 10 QD. 5.  Ventricular Tachycardia s/p AICD:  FU with EP as planned. 6.  LV Mural Thrombus:  Continue Coumadin. 7.  Hypertension:  Fair control on current regimen.  8.  Hyperlipidemia:  Continue statin. 9.  Chronic Kidney Disease:  Will need to keep a close eye on renal function with addition of Lasix as noted.  10. Snoring:  He has symptoms of sleep apnea.  He will ultimately need to be set up for a sleep study.    Current medicines are reviewed at length with the patient today.  The patient does not have concerns regarding medicines.  The following changes have been made:  As above.   Labs/ tests ordered today include:  Orders Placed This Encounter  Procedures  . DG Chest 2 View  . Basic metabolic panel  . CBC with Differential/Platelet  . Brain natriuretic peptide  . TSH  . Basic metabolic panel  . EKG 12-Lead    Disposition:   FU with Dr. Lewayne Bunting or me in 1 week.    Signed, Brynda Rim, MHS 03/09/2014 11:25 AM    [facility] [address] [place], [place], [place]  [address] Phone: [phone]; Fax: [phone]

## 2014-03-09 NOTE — Telephone Encounter (Signed)
both pt and wife have been notified of lab and cxr results with verbal understanding.

## 2014-03-09 NOTE — Patient Instructions (Signed)
Your physician recommends that you return for lab work in:  TODAY BMET, CBC W/DIFF,TSH, BNP  A chest x-ray @ [address]; [facility]. takes a picture of the organs and structures inside the chest, including the heart, lungs, and blood vessels. This test can show several things, including, whether the heart is enlarges; whether fluid is building up in the lungs; and whether pacemaker / defibrillator leads are still in place.   REPEAT BMET IN 1 WEEK WHEN YOU SEE SCOTT WEAVER, PAC AT YOUR FOLLOW UP ON 03/19/14  YOU HAVE A FOLLOW UP WITH [place], [facility] 03/19/14 @ 12:10  YOUR APPT WITH COUMADIN HAS BEEN MOVED TO 03/19/14 @ 12:45  Your physician has recommended you make the following change in your medication:  1. START LASIX 20 MG DAILY; NEW RX SENT IN TODAY  YOU WILL NEED TO KEEP YOUR APPT ON 04/05/14 WITH THE DEVICE CLINIC PER DEVICE CLINIC

## 2014-03-19 ENCOUNTER — Telehealth: Payer: Self-pay | Admitting: *Deleted

## 2014-03-19 ENCOUNTER — Ambulatory Visit (INDEPENDENT_AMBULATORY_CARE_PROVIDER_SITE_OTHER): Payer: Medicare Other | Admitting: Physician Assistant

## 2014-03-19 ENCOUNTER — Ambulatory Visit (INDEPENDENT_AMBULATORY_CARE_PROVIDER_SITE_OTHER): Payer: Medicare Other | Admitting: *Deleted

## 2014-03-19 ENCOUNTER — Encounter: Payer: Self-pay | Admitting: Physician Assistant

## 2014-03-19 VITALS — BP 120/80 | HR 79 | Ht 69.0 in | Wt 191.0 lb

## 2014-03-19 DIAGNOSIS — R0683 Snoring: Secondary | ICD-10-CM

## 2014-03-19 DIAGNOSIS — N182 Chronic kidney disease, stage 2 (mild): Secondary | ICD-10-CM

## 2014-03-19 DIAGNOSIS — Z5181 Encounter for therapeutic drug level monitoring: Secondary | ICD-10-CM

## 2014-03-19 DIAGNOSIS — I472 Ventricular tachycardia, unspecified: Secondary | ICD-10-CM

## 2014-03-19 DIAGNOSIS — I255 Ischemic cardiomyopathy: Secondary | ICD-10-CM

## 2014-03-19 DIAGNOSIS — I513 Intracardiac thrombosis, not elsewhere classified: Secondary | ICD-10-CM

## 2014-03-19 DIAGNOSIS — I10 Essential (primary) hypertension: Secondary | ICD-10-CM

## 2014-03-19 DIAGNOSIS — I2109 ST elevation (STEMI) myocardial infarction involving other coronary artery of anterior wall: Secondary | ICD-10-CM

## 2014-03-19 DIAGNOSIS — E785 Hyperlipidemia, unspecified: Secondary | ICD-10-CM

## 2014-03-19 DIAGNOSIS — I2510 Atherosclerotic heart disease of native coronary artery without angina pectoris: Secondary | ICD-10-CM

## 2014-03-19 DIAGNOSIS — I5022 Chronic systolic (congestive) heart failure: Secondary | ICD-10-CM

## 2014-03-19 LAB — BASIC METABOLIC PANEL
BUN: 21 mg/dL (ref 6–23)
CALCIUM: 9.6 mg/dL (ref 8.4–10.5)
CHLORIDE: 98 meq/L (ref 96–112)
CO2: 33 mEq/L — ABNORMAL HIGH (ref 19–32)
Creatinine, Ser: 1.38 mg/dL (ref 0.40–1.50)
GFR: 53 mL/min — ABNORMAL LOW (ref >60.00)
GLUCOSE: 134 mg/dL — AB (ref 70–99)
POTASSIUM: 4.4 meq/L (ref 3.5–5.1)
SODIUM: 137 meq/L (ref 135–145)

## 2014-03-19 LAB — POCT INR: INR: 2.1

## 2014-03-19 NOTE — Patient Instructions (Signed)
Your physician recommends that you return for lab work in: TODAY BMET  FOLLOW UP WITH DR. Ladona Ridgel IN 06/2014  Your physician recommends that you continue on your current medications as directed. Please refer to the Current Medication list given to you today.

## 2014-03-19 NOTE — Progress Notes (Signed)
Cardiology Office Note   Date:  03/19/2014   ID:  [name], [name] [birth year], MRN [id number]  PCP:  Caroline Sauger, MD  Cardiologist/Electrophysiologist:  Dr. Lewayne Bunting    Chief Complaint  Patient presents with  . Congestive Heart Failure    Follow up     History of Present Illness: [name] is a 80 y.o. male with a hx of CAD (s/p MI in 1992 tx with POBA of LAD c/b reocclusion and recanalization, s/p stent to RCA), ischemic CM, systolic CHF, VTach s/p AICD, HTN.   Admitted 03/2013 with acute respiratory failure in the setting of bronchitis/influenza (influenza A+). He had mildly elevated CEs at an outside hospital.  Chest CTA (at [facility]) was negative for pulmonary embolism. Echo demonstrated reduced LVF and probable apical clot that was confirmed by TEE and he was started on warfarin.  I saw him 2/2/216 with increased DOE and a/c systolic HF.  CoreVue demonstrated decreased thoracic impedence.  I started him on Lasix.  He returns for FU.  He is feeling much better.  He is having less dyspnea.  He is NYHA 2-2b.  He denies orthopnea, PND, edema.  No chest pain.  No syncope.  No cough.   Studies/Reports Reviewed Today:   - Cardiolite (01/2000): Apical, ant, ant-septal scar, no ischemia; EF 27%.   - LHC (10/2006): pLAD 40, pRCA stent 40 ISR, dRCA 30 ISR; EF 35%.   - Echo (03/23/13): Mod LV dysfunction, Gr 1 DD, probable apical clot.  - TEE (03/24/13): EF 20-25%, poss small calcified apical clot, trivial MR, grade IV plaque in aortic arch.    CXR 03/09/14 IMPRESSION:  Borderline cardiomegaly with mild pulmonary vascular and interstitial prominence. Mild CHF cannot be excluded. Cardiac pacer stable position.    Past Medical History  Diagnosis Date  . HTN (hypertension)   . Ischemic cardiomyopathy     EF 20-25%  . Presence of permanent cardiac pacemaker   . Chronic systolic CHF (congestive heart failure)   . CAD (coronary artery disease)     a. s/p  MI  in 1992 tx with POBA of LAD c/b reocclusion and recanalization; s/p stent to RCA;  b. Cardiolite (01/2000): Apical, ant, ant-septal scar, no ischemia; EF 27%;  c. LHC (10/2006): pLAD 40, pRCA stent 40 ISR, dRCA 30 ISR; EF 35%   . Apical mural thrombus 03/23/2013    a. Echo (03/23/13): Mod LV dysfunction, Gr 1 DD, probable apical clot;  b. TEE (03/24/13): EF 20-25%, poss small calcified apical clot, trivial MR, grade IV plaque in aortic arch   . Ventricular tachycardia   . AICD (automatic cardioverter/defibrillator) present   . HLD (hyperlipidemia)   . CKD (chronic kidney disease)     Past Surgical History  Procedure Laterality Date  . Angioplasty / stenting femoral    . Cardiac defibrillator placement      ICD-St Jude  . Tee without cardioversion N/A 03/24/2013    Procedure: TRANSESOPHAGEAL ECHOCARDIOGRAM (TEE);  Surgeon: Laurey Morale, MD;  Location: [facility] ENDOSCOPY;  Service: Cardiovascular;  Laterality: N/A;     Current Outpatient Prescriptions  Medication Sig Dispense Refill  . aspirin 81 MG tablet Take 162 mg by mouth at bedtime.     Marland Kitchen atorvastatin (LIPITOR) 20 MG tablet Take 20 mg by mouth daily.    . carvedilol (COREG) 3.125 MG tablet Take 3.125 mg by mouth 2 (two) times daily with a meal.      . cholecalciferol (VITAMIN D) 1000  UNITS tablet Take 1,000 Units by mouth daily.    . furosemide (LASIX) 20 MG tablet Take 1 tablet (20 mg total) by mouth daily. 30 tablet 5  . lisinopril (PRINIVIL,ZESTRIL) 20 MG tablet Take 0.5 tablets (10 mg total) by mouth daily.    . niacin (NIASPAN) 1000 MG CR tablet Take 1,000 mg by mouth at bedtime.     . nitroGLYCERIN (NITROSTAT) 0.4 MG SL tablet Place 1 tablet (0.4 mg total) under the tongue every 5 (five) minutes as needed. 30 tablet 1  . omeprazole (PRILOSEC) 20 MG capsule Take 10 mg by mouth daily.     Marland Kitchen warfarin (COUMADIN) 5 MG tablet Take as directed by coumadin clinic 30 tablet 3   No current facility-administered medications for this visit.     Allergies:   Review of patient's allergies indicates no known allergies.    Social History:  The patient  reports that he quit smoking about 24 years ago. His smoking use included Cigarettes. He does not have any smokeless tobacco history on file. He reports that he does not drink alcohol or use illicit drugs.   Family History:  The patient's family history includes AAA (abdominal aortic aneurysm) in his father; Diabetes in his mother. There is no history of Heart attack or Stroke.    ROS:  Please see the history of present illness.      All other systems are reviewed and negative.    PHYSICAL EXAM: VS:  BP 120/80 mmHg  Pulse 79  Ht 5\' 9"  (1.753 m)  Wt 191 lb (86.637 kg)  BMI 28.19 kg/m2    Wt Readings from Last 3 Encounters:  03/19/14 191 lb (86.637 kg)  03/09/14 196 lb 12.8 oz (89.268 kg)  06/23/13 194 lb 1.9 oz (88.052 kg)     GEN: Well nourished, well developed, in no acute distress HEENT: normal Neck: no JVD, no masses Cardiac: Distant S1/S2, RRR; no murmur, no rubs or gallops, no edema  Respiratory:  Decreased breath sounds bilaterally, no wheezing, rhonchi or rales. GI: soft, nontender, nondistended, + BS MS: no deformity or atrophy Skin: warm and dry  Neuro:  CNs II-XII intact, Strength and sensation are intact Psych: Normal affect   EKG:  EKG is not ordered today.  It demonstrates:   n/a  Device Interrogation:  CoreVue demonstrates increased thoracic impedence c/w improved volume status.   Recent Labs: 03/21/2013: ALT 15 03/09/2014: BUN 13; Creatinine 1.21; Hemoglobin 14.8; Platelets 168.0; Potassium 4.8; Pro B Natriuretic peptide (BNP) 476.0*; Sodium 136; TSH 3.56    Recent Labs  11/19/13 1150 12/10/13 1306 01/07/14 1301 02/04/14 1229  INR 3.2 2.2 2.2 2.0     Lipid Panel No results found for: CHOL, TRIG, HDL, CHOLHDL, VLDL, LDLCALC, LDLDIRECT    ASSESSMENT AND PLAN:  1.  Chronic Systolic CHF:  Volume improved.  CoreVue measurements improved.   Continue current dose of Lasix, which he is tolerating.      -  FU BMET due today. 2.  Ischemic Cardiomyopathy:  EF 20-25% by echo last year.  Continue Coreg, Lisinopril.  3.  Coronary Artery Disease:  No angina.  Continue ASA, Lipitor 20, Coreg 3.125 bid, Lisinopril 10 QD. 5.  Ventricular Tachycardia s/p AICD:  FU with EP as planned. 6.  LV Mural Thrombus:  Continue Coumadin. 7.  Hypertension:  BP at target. 8.  Hyperlipidemia:  Continue statin. 9.  Chronic Kidney Disease:  Repeat BMET today to assess renal function and K+. 10. Snoring:  He  has some symptoms of sleep apnea.  However, he declines further testing at this time.    Current medicines are reviewed at length with the patient today.  The patient does not have concerns regarding medicines.  The following changes have been made:  None.   Labs/ tests ordered today include:  Orders Placed This Encounter  Procedures  . Basic metabolic panel    Disposition:   FU with Dr. Lewayne Bunting 06/2014.   Signed, Brynda Rim, MHS 03/19/2014 11:53 AM    [facility] [address] [place], [place], [place]  [address] Phone: [phone]; Fax: [phone]

## 2014-03-19 NOTE — Telephone Encounter (Signed)
pt notified about lab results with verbal understanding  

## 2014-03-23 ENCOUNTER — Encounter: Payer: Self-pay | Admitting: Internal Medicine

## 2014-04-05 ENCOUNTER — Ambulatory Visit (INDEPENDENT_AMBULATORY_CARE_PROVIDER_SITE_OTHER): Payer: Medicare Other | Admitting: *Deleted

## 2014-04-05 DIAGNOSIS — I255 Ischemic cardiomyopathy: Secondary | ICD-10-CM

## 2014-04-05 DIAGNOSIS — I5022 Chronic systolic (congestive) heart failure: Secondary | ICD-10-CM

## 2014-04-05 NOTE — Progress Notes (Signed)
Remote ICD transmission.   

## 2014-04-11 LAB — MDC_IDC_ENUM_SESS_TYPE_REMOTE
Battery Remaining Percentage: 65 %
Brady Statistic RV Percent Paced: 1 %
HIGH POWER IMPEDANCE MEASURED VALUE: 61 Ohm
Lead Channel Sensing Intrinsic Amplitude: 12 mV
Lead Channel Setting Pacing Pulse Width: 0.5 ms
MDC IDC MSMT BATTERY REMAINING LONGEVITY: 77 mo
MDC IDC MSMT BATTERY VOLTAGE: 2.96 V
MDC IDC MSMT LEADCHNL RV IMPEDANCE VALUE: 460 Ohm
MDC IDC MSMT LEADCHNL RV PACING THRESHOLD AMPLITUDE: 0.75 V
MDC IDC MSMT LEADCHNL RV PACING THRESHOLD PULSEWIDTH: 0.5 ms
MDC IDC PG SERIAL: 788888
MDC IDC SESS DTM: 20160229082421
MDC IDC SET LEADCHNL RV PACING AMPLITUDE: 2.5 V
MDC IDC SET LEADCHNL RV SENSING SENSITIVITY: 0.5 mV
MDC IDC SET ZONE DETECTION INTERVAL: 270 ms
Zone Setting Detection Interval: 300 ms
Zone Setting Detection Interval: 350 ms

## 2014-04-13 ENCOUNTER — Encounter: Payer: Self-pay | Admitting: Internal Medicine

## 2014-04-22 ENCOUNTER — Encounter: Payer: Self-pay | Admitting: Cardiology

## 2014-04-28 ENCOUNTER — Encounter: Payer: Self-pay | Admitting: Internal Medicine

## 2014-04-30 ENCOUNTER — Ambulatory Visit (INDEPENDENT_AMBULATORY_CARE_PROVIDER_SITE_OTHER): Payer: Medicare Other | Admitting: Pharmacist

## 2014-04-30 DIAGNOSIS — Z5181 Encounter for therapeutic drug level monitoring: Secondary | ICD-10-CM | POA: Diagnosis not present

## 2014-04-30 DIAGNOSIS — I513 Intracardiac thrombosis, not elsewhere classified: Secondary | ICD-10-CM

## 2014-04-30 DIAGNOSIS — I2109 ST elevation (STEMI) myocardial infarction involving other coronary artery of anterior wall: Secondary | ICD-10-CM | POA: Diagnosis not present

## 2014-04-30 LAB — POCT INR: INR: 3.1

## 2014-06-22 ENCOUNTER — Encounter: Payer: Self-pay | Admitting: Internal Medicine

## 2014-06-22 ENCOUNTER — Ambulatory Visit (INDEPENDENT_AMBULATORY_CARE_PROVIDER_SITE_OTHER): Payer: Medicare Other | Admitting: Internal Medicine

## 2014-06-22 ENCOUNTER — Ambulatory Visit (INDEPENDENT_AMBULATORY_CARE_PROVIDER_SITE_OTHER): Payer: Medicare Other | Admitting: *Deleted

## 2014-06-22 VITALS — BP 143/86 | HR 68 | Ht 69.0 in | Wt 191.8 lb

## 2014-06-22 DIAGNOSIS — Z5181 Encounter for therapeutic drug level monitoring: Secondary | ICD-10-CM | POA: Diagnosis not present

## 2014-06-22 DIAGNOSIS — I255 Ischemic cardiomyopathy: Secondary | ICD-10-CM

## 2014-06-22 DIAGNOSIS — Z95810 Presence of automatic (implantable) cardiac defibrillator: Secondary | ICD-10-CM

## 2014-06-22 DIAGNOSIS — I5022 Chronic systolic (congestive) heart failure: Secondary | ICD-10-CM | POA: Diagnosis not present

## 2014-06-22 DIAGNOSIS — I472 Ventricular tachycardia, unspecified: Secondary | ICD-10-CM

## 2014-06-22 DIAGNOSIS — I10 Essential (primary) hypertension: Secondary | ICD-10-CM

## 2014-06-22 DIAGNOSIS — I2109 ST elevation (STEMI) myocardial infarction involving other coronary artery of anterior wall: Secondary | ICD-10-CM

## 2014-06-22 DIAGNOSIS — I513 Intracardiac thrombosis, not elsewhere classified: Secondary | ICD-10-CM

## 2014-06-22 LAB — CUP PACEART INCLINIC DEVICE CHECK
Battery Remaining Longevity: 74.4 mo
Brady Statistic RV Percent Paced: 0.54 %
HIGH POWER IMPEDANCE MEASURED VALUE: 52.5321
Lead Channel Impedance Value: 437.5 Ohm
Lead Channel Pacing Threshold Amplitude: 0.75 V
Lead Channel Pacing Threshold Pulse Width: 0.5 ms
Lead Channel Sensing Intrinsic Amplitude: 12 mV
Lead Channel Setting Sensing Sensitivity: 0.5 mV
MDC IDC SESS DTM: 20160517121922
MDC IDC SET LEADCHNL RV PACING AMPLITUDE: 2.5 V
MDC IDC SET LEADCHNL RV PACING PULSEWIDTH: 0.5 ms
MDC IDC SET ZONE DETECTION INTERVAL: 270 ms
MDC IDC SET ZONE DETECTION INTERVAL: 300 ms
Pulse Gen Serial Number: 788888
Zone Setting Detection Interval: 350 ms

## 2014-06-22 LAB — POCT INR: INR: 2.2

## 2014-06-22 NOTE — Assessment & Plan Note (Signed)
His blood pressure is slightly elevated. He is encouraged to reduce his sodium intake which is significant. He will continue his current meds.

## 2014-06-22 NOTE — Assessment & Plan Note (Signed)
His symptoms are class 2B. He is encouraged to eat less sodium, take his medications, and continue his current meds.

## 2014-06-22 NOTE — Patient Instructions (Signed)
Medication Instructions:  Your physician recommends that you continue on your current medications as directed. Please refer to the Current Medication list given to you today.   Labwork: None ordered  Testing/Procedures: None ordered  Follow-Up: Your physician wants you to follow-up in: 12 months with Dr Taylor You will receive a reminder letter in the mail two months in advance. If you don't receive a letter, please call our office to schedule the follow-up appointment.  Remote monitoring is used to monitor your Pacemaker or ICD from home. This monitoring reduces the number of office visits required to check your device to one time per year. It allows us to keep an eye on the functioning of your device to ensure it is working properly. You are scheduled for a device check from home on 09/21/14. You may send your transmission at any time that day. If you have a wireless device, the transmission will be sent automatically. After your physician reviews your transmission, you will receive a postcard with your next transmission date.    Any Other Special Instructions Will Be Listed Below (If Applicable).   

## 2014-06-22 NOTE — Assessment & Plan Note (Signed)
His St. Jude ICD is working normally. We will recheck in several months.

## 2014-06-22 NOTE — Assessment & Plan Note (Signed)
He has had no recurrent VT. He will continue his current meds.  

## 2014-06-22 NOTE — Progress Notes (Signed)
HPI [name] returns today for ICD followup. He is a pleasant 80 yo man with an ICM, chronic systolic heart failure and is s/p ICD implant. In the interim, he has done well. He has class 2B dyspnea. He denies chest pain or syncope or ICD shock.   No Known Allergies   Current Outpatient Prescriptions  Medication Sig Dispense Refill  . aspirin 81 MG tablet Take 162 mg by mouth at bedtime.     Marland Kitchen atorvastatin (LIPITOR) 20 MG tablet Take 20 mg by mouth daily.    . carvedilol (COREG) 3.125 MG tablet Take 3.125 mg by mouth 2 (two) times daily with a meal.      . cholecalciferol (VITAMIN D) 1000 UNITS tablet Take 1,000 Units by mouth daily.    . furosemide (LASIX) 20 MG tablet Take 1 tablet (20 mg total) by mouth daily. 30 tablet 5  . lisinopril (PRINIVIL,ZESTRIL) 20 MG tablet Take 0.5 tablets (10 mg total) by mouth daily.    . niacin (NIASPAN) 1000 MG CR tablet Take 1,000 mg by mouth at bedtime.     . nitroGLYCERIN (NITROSTAT) 0.4 MG SL tablet Place 1 tablet (0.4 mg total) under the tongue every 5 (five) minutes as needed. 30 tablet 1  . omeprazole (PRILOSEC) 20 MG capsule Take 10 mg by mouth daily.     Marland Kitchen warfarin (COUMADIN) 5 MG tablet Take as directed by coumadin clinic 30 tablet 3   No current facility-administered medications for this visit.     Past Medical History  Diagnosis Date  . HTN (hypertension)   . Ischemic cardiomyopathy     EF 20-25%  . Presence of permanent cardiac pacemaker   . Chronic systolic CHF (congestive heart failure)   . CAD (coronary artery disease)     a. s/p  MI in 1992 tx with POBA of LAD c/b reocclusion and recanalization; s/p stent to RCA;  b. Cardiolite (01/2000): Apical, ant, ant-septal scar, no ischemia; EF 27%;  c. LHC (10/2006): pLAD 40, pRCA stent 40 ISR, dRCA 30 ISR; EF 35%   . Apical mural thrombus 03/23/2013    a. Echo (03/23/13): Mod LV dysfunction, Gr 1 DD, probable apical clot;  b. TEE (03/24/13): EF 20-25%, poss small calcified  apical clot, trivial MR, grade IV plaque in aortic arch   . Ventricular tachycardia   . AICD (automatic cardioverter/defibrillator) present   . HLD (hyperlipidemia)   . CKD (chronic kidney disease)     ROS:   All systems reviewed and negative except as noted in the HPI.   Past Surgical History  Procedure Laterality Date  . Angioplasty / stenting femoral    . Cardiac defibrillator placement      ICD-St Jude  . Tee without cardioversion N/A 03/24/2013    Procedure: TRANSESOPHAGEAL ECHOCARDIOGRAM (TEE);  Surgeon: Laurey Morale, MD;  Location: [facility] ENDOSCOPY;  Service: Cardiovascular;  Laterality: N/A;     Family History  Problem Relation Age of Onset  . AAA (abdominal aortic aneurysm) Father   . Diabetes Mother   . Heart attack Neg Hx   . Stroke Neg Hx      History   Social History  . Marital Status: Married    Spouse Name: N/A  . Number of Children: N/A  . Years of Education: N/A   Occupational History  . Not on file.   Social History Main Topics  . Smoking status: Former Smoker    Types: Cigarettes    Quit  date: 03/21/1990  . Smokeless tobacco: Not on file  . Alcohol Use: No  . Drug Use: No  . Sexual Activity: Not on file   Other Topics Concern  . Not on file   Social History Narrative     BP 143/86 mmHg  Pulse 68  Ht  (1.753 m)  Wt 191 lb 12.8 oz (87 kg)  BMI 28.31 kg/m2  Physical Exam:  Well appearing 80 yo woman, NAD HEENT: Unremarkable Neck:  No JVD, no thyromegally Back:  No CVA tenderness Lungs:  Clear with no wheezes HEART:  Regular rate rhythm, no murmurs, no rubs, no clicks Abd:  soft, positive bowel sounds, no organomegally, no rebound, no guarding Ext:  2 plus pulses, no edema, no cyanosis, no clubbing Skin:  No rashes no nodules Neuro:  CN II through XII intact, motor grossly intact  DEVICE  Normal device function.  See PaceArt for details.   Assess/Plan:

## 2014-07-14 ENCOUNTER — Other Ambulatory Visit: Payer: Self-pay | Admitting: Internal Medicine

## 2014-08-02 ENCOUNTER — Telehealth: Payer: Self-pay | Admitting: Internal Medicine

## 2014-08-02 ENCOUNTER — Telehealth: Payer: Self-pay | Admitting: *Deleted

## 2014-08-02 DIAGNOSIS — I5022 Chronic systolic (congestive) heart failure: Secondary | ICD-10-CM

## 2014-08-02 MED ORDER — ALBUTEROL SULFATE HFA 108 (90 BASE) MCG/ACT IN AERS: 2.0000 | INHALATION_SPRAY | Freq: Four times a day (QID) | RESPIRATORY_TRACT | Status: DC | PRN

## 2014-08-02 NOTE — Telephone Encounter (Signed)
Wife calling stating that she took her husband to [facility] ER for SOB, wheezing and a cough.  States they gave kept him overnight.  Did a chest xray and was told had fluid on lungs.  She states they did give him Lasix and 3 breathing treatments.  His oxygen sat were up and down. At times he was on oxygen but when he was discharged his O2 sat was 91 so told them he didn't need to have oxygen.  States that at 4 AM he has been SOB, wheezing and coughing.  States he is asleep now with head slightly elevated. She also states that she found out after they came home from hospital that he had not taken his Lasix Fri or Sat. States BP was normal in hospital.  Spoke w/Lori Imelda Pillow who suggested he take 40 mg of Lasix for 3 days, come in Thursday for BMET and BNP.  Also advised to get records from [facility].  Pt is scheduled for coumadin check tomorrow and wife doesn't feel like he can come so moved app to Thursday 6/30 at 3:00 and will get lab at 2:30.  Advised wife to ask specifically for chest xray and lab and any other test he might have had as well as hospital summary.  Gave her our fax number and advised her to ask them to send urgently.  If any changes need to be made after receiving records will call her back. She verbalizes understanding and will call if he doesn't seem to be better after taking the Lasix.

## 2014-08-02 NOTE — Telephone Encounter (Signed)
Received records from [facility].  Reviewed by Sharon Seller.  CXR showed COPD. Lawson Fiscal suggests that send Rx in for Albuterol inhaler q 8 hrs and continue Lasix 40 mg until lab results back.  Notified wife that will sending Rx into [facility].  She states when he woke up he is still wheezing.  Advised to make sure he doesn't skip any dosage of Lasix.

## 2014-08-02 NOTE — Telephone Encounter (Signed)
Pt c/o Shortness Of Breath: STAT if SOB developed within the last 24 hours or pt is noticeably SOB on the phone  1. Are you currently SOB (can you hear that pt is SOB on the phone)? No 2. How long have you been experiencing SOB? PT was in the [place] in [place] over the weekend 3. Are you SOB when sitting or when up moving around? Both 4.  Are you currently experiencing any other symptoms? No, Pt is currently sleeping.  Comments: Pt wife called states that he went into the ER on Saturday because of Wheezing and SOB. Stayed 24 hours found that he had fluid on his lungs. Oxygen levels went up and down. Upon release the doctors decided that he would do all right without oxygen so they didn't release him with it now he is experiencing SOB and Wheezing again. Per wife they were advised to call the cardiologist 1st thing Monday (TODAY) if symptoms persist. Wife states that the pt is currently sleeping but he has been up all night wheezing. Please assist

## 2014-08-05 ENCOUNTER — Other Ambulatory Visit (INDEPENDENT_AMBULATORY_CARE_PROVIDER_SITE_OTHER): Payer: Medicare Other | Admitting: *Deleted

## 2014-08-05 ENCOUNTER — Ambulatory Visit (INDEPENDENT_AMBULATORY_CARE_PROVIDER_SITE_OTHER): Payer: Medicare Other | Admitting: *Deleted

## 2014-08-05 DIAGNOSIS — Z5181 Encounter for therapeutic drug level monitoring: Secondary | ICD-10-CM | POA: Diagnosis not present

## 2014-08-05 DIAGNOSIS — I513 Intracardiac thrombosis, not elsewhere classified: Secondary | ICD-10-CM

## 2014-08-05 DIAGNOSIS — I5022 Chronic systolic (congestive) heart failure: Secondary | ICD-10-CM | POA: Diagnosis not present

## 2014-08-05 DIAGNOSIS — I2109 ST elevation (STEMI) myocardial infarction involving other coronary artery of anterior wall: Secondary | ICD-10-CM | POA: Diagnosis not present

## 2014-08-05 LAB — BRAIN NATRIURETIC PEPTIDE: Pro B Natriuretic peptide (BNP): 494 pg/mL — ABNORMAL HIGH (ref 0.0–100.0)

## 2014-08-05 LAB — BASIC METABOLIC PANEL
BUN: 20 mg/dL (ref 6–23)
CALCIUM: 9 mg/dL (ref 8.4–10.5)
CO2: 34 meq/L — AB (ref 19–32)
CREATININE: 1.36 mg/dL (ref 0.40–1.50)
Chloride: 93 mEq/L — ABNORMAL LOW (ref 96–112)
GFR: 53.84 mL/min — AB (ref >60.00)
Glucose, Bld: 195 mg/dL — ABNORMAL HIGH (ref 70–99)
POTASSIUM: 3.6 meq/L (ref 3.5–5.1)
Sodium: 135 mEq/L (ref 135–145)

## 2014-08-05 LAB — POCT INR: INR: 1.7

## 2014-08-19 ENCOUNTER — Ambulatory Visit (INDEPENDENT_AMBULATORY_CARE_PROVIDER_SITE_OTHER): Payer: Medicare Other | Admitting: Pharmacist

## 2014-08-19 DIAGNOSIS — I2109 ST elevation (STEMI) myocardial infarction involving other coronary artery of anterior wall: Secondary | ICD-10-CM

## 2014-08-19 DIAGNOSIS — Z5181 Encounter for therapeutic drug level monitoring: Secondary | ICD-10-CM | POA: Diagnosis not present

## 2014-08-19 DIAGNOSIS — I513 Intracardiac thrombosis, not elsewhere classified: Secondary | ICD-10-CM

## 2014-08-19 LAB — POCT INR: INR: 2.2

## 2014-09-09 ENCOUNTER — Ambulatory Visit (INDEPENDENT_AMBULATORY_CARE_PROVIDER_SITE_OTHER): Payer: Medicare Other | Admitting: Pharmacist

## 2014-09-09 DIAGNOSIS — I2109 ST elevation (STEMI) myocardial infarction involving other coronary artery of anterior wall: Secondary | ICD-10-CM

## 2014-09-09 DIAGNOSIS — I513 Intracardiac thrombosis, not elsewhere classified: Secondary | ICD-10-CM

## 2014-09-09 DIAGNOSIS — Z5181 Encounter for therapeutic drug level monitoring: Secondary | ICD-10-CM

## 2014-09-09 LAB — POCT INR: INR: 2.7

## 2014-09-21 ENCOUNTER — Ambulatory Visit (INDEPENDENT_AMBULATORY_CARE_PROVIDER_SITE_OTHER): Payer: Medicare Other | Admitting: *Deleted

## 2014-09-21 DIAGNOSIS — I5022 Chronic systolic (congestive) heart failure: Secondary | ICD-10-CM | POA: Diagnosis not present

## 2014-09-21 DIAGNOSIS — I255 Ischemic cardiomyopathy: Secondary | ICD-10-CM

## 2014-09-21 NOTE — Progress Notes (Signed)
Remote ICD transmission.   

## 2014-09-28 LAB — CUP PACEART REMOTE DEVICE CHECK
Battery Remaining Longevity: 71 mo
Battery Remaining Percentage: 61 %
Battery Voltage: 2.96 V
HighPow Impedance: 44 Ohm
Lead Channel Impedance Value: 390 Ohm
Lead Channel Pacing Threshold Amplitude: 0.75 V
Lead Channel Pacing Threshold Pulse Width: 0.5 ms
Lead Channel Setting Pacing Amplitude: 2.5 V
Lead Channel Setting Pacing Pulse Width: 0.5 ms
Lead Channel Setting Sensing Sensitivity: 0.5 mV
MDC IDC MSMT LEADCHNL RV SENSING INTR AMPL: 11.4 mV
MDC IDC SESS DTM: 20160816072121
MDC IDC SET ZONE DETECTION INTERVAL: 270 ms
MDC IDC STAT BRADY RV PERCENT PACED: 1 %
Pulse Gen Serial Number: 788888
Zone Setting Detection Interval: 300 ms
Zone Setting Detection Interval: 350 ms

## 2014-09-29 ENCOUNTER — Other Ambulatory Visit: Payer: Self-pay | Admitting: Physician Assistant

## 2014-10-06 ENCOUNTER — Encounter: Payer: Self-pay | Admitting: Cardiology

## 2014-10-07 ENCOUNTER — Ambulatory Visit (INDEPENDENT_AMBULATORY_CARE_PROVIDER_SITE_OTHER): Payer: Medicare Other | Admitting: *Deleted

## 2014-10-07 DIAGNOSIS — I2109 ST elevation (STEMI) myocardial infarction involving other coronary artery of anterior wall: Secondary | ICD-10-CM

## 2014-10-07 DIAGNOSIS — Z5181 Encounter for therapeutic drug level monitoring: Secondary | ICD-10-CM | POA: Diagnosis not present

## 2014-10-07 DIAGNOSIS — I513 Intracardiac thrombosis, not elsewhere classified: Secondary | ICD-10-CM

## 2014-10-07 LAB — POCT INR: INR: 2.9

## 2014-10-12 ENCOUNTER — Encounter: Payer: Self-pay | Admitting: Internal Medicine

## 2014-11-07 ENCOUNTER — Other Ambulatory Visit: Payer: Self-pay | Admitting: Internal Medicine

## 2014-11-18 ENCOUNTER — Ambulatory Visit (INDEPENDENT_AMBULATORY_CARE_PROVIDER_SITE_OTHER): Payer: Medicare Other | Admitting: Pharmacist

## 2014-11-18 DIAGNOSIS — I513 Intracardiac thrombosis, not elsewhere classified: Secondary | ICD-10-CM

## 2014-11-18 DIAGNOSIS — Z5181 Encounter for therapeutic drug level monitoring: Secondary | ICD-10-CM

## 2014-11-18 DIAGNOSIS — I213 ST elevation (STEMI) myocardial infarction of unspecified site: Secondary | ICD-10-CM

## 2014-11-18 LAB — POCT INR: INR: 2.3

## 2014-12-21 ENCOUNTER — Telehealth: Payer: Self-pay | Admitting: Internal Medicine

## 2014-12-21 NOTE — Telephone Encounter (Signed)
New Message   Pt requested to speak w/ Device concerning letter she received from [facility]. Please call back and discuss.

## 2014-12-21 NOTE — Telephone Encounter (Signed)
Spoke w/ pt wife about merlin recall. Explained to her that the battery on the device can deplete very fast. Informed her that we will be keeping an eye on the website and if we notice that pt monitor has not updated in a weeks time we will call have him send a manual transmission. Pt wife verbalized understanding.

## 2014-12-23 ENCOUNTER — Ambulatory Visit (INDEPENDENT_AMBULATORY_CARE_PROVIDER_SITE_OTHER): Payer: Medicare Other | Admitting: *Deleted

## 2014-12-23 DIAGNOSIS — I255 Ischemic cardiomyopathy: Secondary | ICD-10-CM | POA: Diagnosis not present

## 2014-12-23 DIAGNOSIS — I5022 Chronic systolic (congestive) heart failure: Secondary | ICD-10-CM | POA: Diagnosis not present

## 2014-12-23 NOTE — Progress Notes (Signed)
Remote ICD transmission.   

## 2014-12-29 ENCOUNTER — Ambulatory Visit (INDEPENDENT_AMBULATORY_CARE_PROVIDER_SITE_OTHER): Payer: Medicare Other | Admitting: Pharmacist

## 2014-12-29 DIAGNOSIS — I213 ST elevation (STEMI) myocardial infarction of unspecified site: Secondary | ICD-10-CM | POA: Diagnosis not present

## 2014-12-29 DIAGNOSIS — I513 Intracardiac thrombosis, not elsewhere classified: Secondary | ICD-10-CM

## 2014-12-29 DIAGNOSIS — Z5181 Encounter for therapeutic drug level monitoring: Secondary | ICD-10-CM | POA: Diagnosis not present

## 2014-12-29 LAB — POCT INR: INR: 2.5

## 2015-01-07 LAB — CUP PACEART REMOTE DEVICE CHECK
Brady Statistic RV Percent Paced: 1 %
Date Time Interrogation Session: 20161117075452
HIGH POWER IMPEDANCE MEASURED VALUE: 45 Ohm
Implantable Lead Implant Date: 20050830
Implantable Lead Model: 1581
Lead Channel Impedance Value: 390 Ohm
Lead Channel Pacing Threshold Amplitude: 0.75 V
Lead Channel Pacing Threshold Pulse Width: 0.5 ms
Lead Channel Setting Sensing Sensitivity: 0.5 mV
MDC IDC LEAD LOCATION: 753860
MDC IDC MSMT BATTERY REMAINING LONGEVITY: 70 mo
MDC IDC MSMT BATTERY REMAINING PERCENTAGE: 60 %
MDC IDC MSMT BATTERY VOLTAGE: 2.96 V
MDC IDC MSMT LEADCHNL RV SENSING INTR AMPL: 12 mV
MDC IDC SET LEADCHNL RV PACING AMPLITUDE: 2.5 V
MDC IDC SET LEADCHNL RV PACING PULSEWIDTH: 0.5 ms
Pulse Gen Serial Number: 788888

## 2015-01-10 ENCOUNTER — Encounter: Payer: Self-pay | Admitting: Cardiology

## 2015-02-09 ENCOUNTER — Ambulatory Visit (INDEPENDENT_AMBULATORY_CARE_PROVIDER_SITE_OTHER): Payer: Medicare Other | Admitting: Pharmacist

## 2015-02-09 DIAGNOSIS — Z5181 Encounter for therapeutic drug level monitoring: Secondary | ICD-10-CM

## 2015-02-09 DIAGNOSIS — I513 Intracardiac thrombosis, not elsewhere classified: Secondary | ICD-10-CM

## 2015-02-09 DIAGNOSIS — I213 ST elevation (STEMI) myocardial infarction of unspecified site: Secondary | ICD-10-CM

## 2015-02-09 LAB — POCT INR: INR: 2.5

## 2015-02-18 ENCOUNTER — Telehealth: Payer: Self-pay | Admitting: Internal Medicine

## 2015-02-18 DIAGNOSIS — I5020 Unspecified systolic (congestive) heart failure: Secondary | ICD-10-CM

## 2015-02-18 MED ORDER — FUROSEMIDE 40 MG PO TABS: 40.0000 mg | ORAL_TABLET | Freq: Every day | ORAL | Status: DC

## 2015-02-18 NOTE — Telephone Encounter (Signed)
T'S WIFE CALLED BACK TO LET U KNOW THAT THE EXTRA FLUID PILL WORKED WELL-WILL CALL BACK ON Monday IF ANY PROBLEMS OVER THE WEEKEND

## 2015-02-18 NOTE — Telephone Encounter (Signed)
Spoke with wife/ husband c/o sob off and on, not daily  x 1 week. Pt can talk//not currently sob but with any movement he is sob. Denies cp, no swelling in feet, ankles nor abdomen. Pt does not get daily weights.  bp 130/63 p 89 Discussed with Dr Ladona Ridgel, pt to increase lasix to 60 mg x 3 days then increase and remain on lasix 40 mg daily, check bmet 02/25/15. Spoke with wife/ med regime reviewed with lab f/u. Verbalized understanding.

## 2015-02-18 NOTE — Telephone Encounter (Signed)
Pt c/o Shortness Of Breath: STAT if SOB developed within the last 24 hours or pt is noticeably SOB on the phone  1. Are you currently SOB (can you hear that pt is SOB on the phone)? yes  2. How long have you been experiencing SOB? About week   3. Are you SOB when sitting or when up moving around? Moving around   4. Are you currently experiencing any other symptoms? no

## 2015-02-25 ENCOUNTER — Other Ambulatory Visit: Payer: Medicare Other

## 2015-03-21 ENCOUNTER — Telehealth: Payer: Self-pay | Admitting: Internal Medicine

## 2015-03-21 NOTE — Telephone Encounter (Signed)
Pt will not be able to come for lab work on 03-24-15,she wanted you to know that.

## 2015-03-23 ENCOUNTER — Other Ambulatory Visit (INDEPENDENT_AMBULATORY_CARE_PROVIDER_SITE_OTHER): Payer: Medicare Other | Admitting: *Deleted

## 2015-03-23 ENCOUNTER — Ambulatory Visit (INDEPENDENT_AMBULATORY_CARE_PROVIDER_SITE_OTHER): Payer: Medicare Other | Admitting: *Deleted

## 2015-03-23 DIAGNOSIS — Z5181 Encounter for therapeutic drug level monitoring: Secondary | ICD-10-CM | POA: Diagnosis not present

## 2015-03-23 DIAGNOSIS — I213 ST elevation (STEMI) myocardial infarction of unspecified site: Secondary | ICD-10-CM | POA: Diagnosis not present

## 2015-03-23 DIAGNOSIS — I513 Intracardiac thrombosis, not elsewhere classified: Secondary | ICD-10-CM

## 2015-03-23 DIAGNOSIS — I5020 Unspecified systolic (congestive) heart failure: Secondary | ICD-10-CM | POA: Diagnosis not present

## 2015-03-23 LAB — BASIC METABOLIC PANEL
BUN: 19 mg/dL (ref 7–25)
CO2: 31 mmol/L (ref 20–31)
CREATININE: 1.43 mg/dL — AB (ref 0.70–1.18)
Calcium: 9.3 mg/dL (ref 8.6–10.3)
Chloride: 98 mmol/L (ref 98–110)
GLUCOSE: 114 mg/dL — AB (ref 65–99)
POTASSIUM: 3.5 mmol/L (ref 3.5–5.3)
Sodium: 138 mmol/L (ref 135–146)

## 2015-03-23 LAB — POCT INR: INR: 2.2

## 2015-03-23 NOTE — Addendum Note (Signed)
Addended by: Tonita Phoenix on: 03/23/2015 12:56 PM   Modules accepted: Orders

## 2015-03-24 ENCOUNTER — Ambulatory Visit (INDEPENDENT_AMBULATORY_CARE_PROVIDER_SITE_OTHER): Payer: Medicare Other | Admitting: *Deleted

## 2015-03-24 DIAGNOSIS — I255 Ischemic cardiomyopathy: Secondary | ICD-10-CM

## 2015-03-24 DIAGNOSIS — I5022 Chronic systolic (congestive) heart failure: Secondary | ICD-10-CM | POA: Diagnosis not present

## 2015-03-24 NOTE — Progress Notes (Signed)
Remote ICD transmission.   

## 2015-04-19 LAB — CUP PACEART REMOTE DEVICE CHECK
Battery Remaining Longevity: 67 mo
Battery Voltage: 2.96 V
HIGH POWER IMPEDANCE MEASURED VALUE: 44 Ohm
Implantable Lead Implant Date: 20050830
Implantable Lead Location: 753860
Implantable Lead Model: 1581
Lead Channel Sensing Intrinsic Amplitude: 11.4 mV
Lead Channel Setting Pacing Pulse Width: 0.5 ms
Lead Channel Setting Sensing Sensitivity: 0.5 mV
MDC IDC MSMT BATTERY REMAINING PERCENTAGE: 58 %
MDC IDC MSMT LEADCHNL RV IMPEDANCE VALUE: 390 Ohm
MDC IDC SESS DTM: 20170216082319
MDC IDC SET LEADCHNL RV PACING AMPLITUDE: 2.5 V
MDC IDC STAT BRADY RV PERCENT PACED: 1 %
Pulse Gen Serial Number: 788888

## 2015-04-20 ENCOUNTER — Encounter: Payer: Self-pay | Admitting: Cardiology

## 2015-05-06 ENCOUNTER — Ambulatory Visit (INDEPENDENT_AMBULATORY_CARE_PROVIDER_SITE_OTHER): Payer: Medicare Other | Admitting: *Deleted

## 2015-05-06 DIAGNOSIS — I213 ST elevation (STEMI) myocardial infarction of unspecified site: Secondary | ICD-10-CM | POA: Diagnosis not present

## 2015-05-06 DIAGNOSIS — Z5181 Encounter for therapeutic drug level monitoring: Secondary | ICD-10-CM | POA: Diagnosis not present

## 2015-05-06 DIAGNOSIS — I513 Intracardiac thrombosis, not elsewhere classified: Secondary | ICD-10-CM

## 2015-05-06 LAB — POCT INR: INR: 2.5

## 2015-06-04 ENCOUNTER — Other Ambulatory Visit: Payer: Self-pay | Admitting: Internal Medicine

## 2015-06-17 ENCOUNTER — Ambulatory Visit (INDEPENDENT_AMBULATORY_CARE_PROVIDER_SITE_OTHER): Payer: Medicare Other | Admitting: *Deleted

## 2015-06-17 DIAGNOSIS — Z5181 Encounter for therapeutic drug level monitoring: Secondary | ICD-10-CM | POA: Diagnosis not present

## 2015-06-17 DIAGNOSIS — I513 Intracardiac thrombosis, not elsewhere classified: Secondary | ICD-10-CM

## 2015-06-17 DIAGNOSIS — I213 ST elevation (STEMI) myocardial infarction of unspecified site: Secondary | ICD-10-CM | POA: Diagnosis not present

## 2015-06-17 LAB — POCT INR: INR: 2.3

## 2015-07-08 ENCOUNTER — Encounter: Payer: Self-pay | Admitting: Internal Medicine

## 2015-07-08 ENCOUNTER — Ambulatory Visit (INDEPENDENT_AMBULATORY_CARE_PROVIDER_SITE_OTHER): Payer: Medicare Other | Admitting: Internal Medicine

## 2015-07-08 ENCOUNTER — Ambulatory Visit (INDEPENDENT_AMBULATORY_CARE_PROVIDER_SITE_OTHER): Payer: Medicare Other | Admitting: *Deleted

## 2015-07-08 VITALS — BP 98/56 | HR 77 | Ht 69.5 in | Wt 176.8 lb

## 2015-07-08 DIAGNOSIS — Z5181 Encounter for therapeutic drug level monitoring: Secondary | ICD-10-CM | POA: Diagnosis not present

## 2015-07-08 DIAGNOSIS — I513 Intracardiac thrombosis, not elsewhere classified: Secondary | ICD-10-CM

## 2015-07-08 DIAGNOSIS — I213 ST elevation (STEMI) myocardial infarction of unspecified site: Secondary | ICD-10-CM | POA: Diagnosis not present

## 2015-07-08 DIAGNOSIS — I255 Ischemic cardiomyopathy: Secondary | ICD-10-CM | POA: Diagnosis not present

## 2015-07-08 LAB — POCT INR: INR: 2.2

## 2015-07-08 MED ORDER — NITROGLYCERIN 0.4 MG SL SUBL: 0.4000 mg | SUBLINGUAL_TABLET | SUBLINGUAL | Status: AC | PRN

## 2015-07-08 NOTE — Progress Notes (Signed)
HPI [name] returns today for ICD followup. He is a pleasant 81 yo man with an ICM, VT, chronic systolic heart failure and is s/p ICD implant. In the interim, he has done well. He has class 2A CHF symptoms. He denies chest pain or syncope or ICD shock. 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  No Known Allergies   Current Outpatient Prescriptions  Medication Sig Dispense Refill  . aspirin 81 MG tablet Take 81 mg by mouth at bedtime.     Marland Kitchen atorvastatin (LIPITOR) 20 MG tablet Take 10 mg by mouth daily.     . carvedilol (COREG) 3.125 MG tablet Take 3.125 mg by mouth 2 (two) times daily with a meal.      . cholecalciferol (VITAMIN D) 1000 UNITS tablet Take 1,000 Units by mouth daily.    . furosemide (LASIX) 40 MG tablet Take 1 tablet (40 mg total) by mouth daily. 90 tablet 3  . lisinopril (PRINIVIL,ZESTRIL) 20 MG tablet Take 0.5 tablets (10 mg total) by mouth daily.    . niacin (NIASPAN) 1000 MG CR tablet Take 1,000 mg by mouth at bedtime.     . nitroGLYCERIN (NITROSTAT) 0.4 MG SL tablet Place 1 tablet (0.4 mg total) under the tongue every 5 (five) minutes as needed. 30 tablet 1  . omeprazole (PRILOSEC) 20 MG capsule Take 10 mg by mouth daily.     Marland Kitchen warfarin (COUMADIN) 5 MG tablet TAKE AS DIRECTED BY  COUMADIN  CLINIC. 30 tablet 3   No current facility-administered medications for this visit.     Past Medical History  Diagnosis Date  . HTN (hypertension)   . Ischemic cardiomyopathy     EF 20-25%  . Presence of permanent cardiac pacemaker   . Chronic systolic CHF (congestive heart failure) (HCC)   . CAD (coronary artery disease)     a. s/p  MI in 1992 tx with POBA of LAD c/b reocclusion and recanalization; s/p stent to RCA;  b. Cardiolite (01/2000): Apical, ant, ant-septal scar, no ischemia; EF 27%;  c. LHC (10/2006): pLAD 40, pRCA stent 40 ISR, dRCA 30 ISR; EF 35%   . Apical mural thrombus (HCC) 03/23/2013    a. Echo (03/23/13):  Mod LV dysfunction, Gr 1 DD, probable apical clot;  b. TEE (03/24/13): EF 20-25%, poss small calcified apical clot, trivial MR, grade IV plaque in aortic arch   . Ventricular tachycardia (HCC)   . AICD (automatic cardioverter/defibrillator) present   . HLD (hyperlipidemia)   . CKD (chronic kidney disease)     ROS:   All systems reviewed and negative except as noted in the HPI.   Past Surgical History  Procedure Laterality Date  . Angioplasty / stenting femoral    . Cardiac defibrillator placement      ICD-St Jude  . Tee without cardioversion N/A 03/24/2013    Procedure: TRANSESOPHAGEAL ECHOCARDIOGRAM (TEE);  Surgeon: Laurey Morale, MD;  Location: [facility] ENDOSCOPY;  Service: Cardiovascular;  Laterality: N/A;     Family History  Problem Relation Age of Onset  . AAA (abdominal aortic aneurysm) Father   . Diabetes Mother   . Heart attack Neg Hx   . Stroke Neg Hx      Social History   Social History  . Marital Status: Married    Spouse Name: N/A  . Number of Children: N/A  . Years of Education: N/A   Occupational History  . Not on file.  Social History Main Topics  . Smoking status: Former Smoker    Types: Cigarettes    Quit date: 03/21/1990  . Smokeless tobacco: Not on file  . Alcohol Use: No  . Drug Use: No  . Sexual Activity: Not on file   Other Topics Concern  . Not on file   Social History Narrative     BP 98/56 mmHg  Pulse 77  Ht 5' 9.5" (1.765 m)  Wt 176 lb 12.8 oz (80.196 kg)  BMI 25.74 kg/m2  SpO2 93%  Physical Exam:  Well appearing 81 yo woman, NAD HEENT: Unremarkable Neck:  6 cm JVD, no thyromegally Back:  No CVA tenderness Lungs:  Clear with no wheezes HEART:  Regular rate rhythm, no murmurs, no rubs, no clicks Abd:  soft, positive bowel sounds, no organomegally, no rebound, no guarding Ext:  2 plus pulses, no edema, no cyanosis, no clubbing Skin:  No rashes no nodules Neuro:  CN II through XII intact, motor grossly intact  DEVICE    Normal device function.  See PaceArt for details.   Assess/Plan: 1. VT - he has had no episodes since his last visit. No change in meds. 2. Chronic systolic heart failure - his symptoms are class 2. No change in meds. 3. ICD - his St. Jude device is working normally. Will recheck in several months.  [name].D.

## 2015-07-08 NOTE — Patient Instructions (Signed)
Medication Instructions:  Your physician recommends that you continue on your current medications as directed. Please refer to the Current Medication list given to you today.   Labwork: None ordered   Testing/Procedures: None ordered   Follow-Up:  Your physician wants you to follow-up in: 12 months with Dr Taylor You will receive a reminder letter in the mail two months in advance. If you don't receive a letter, please call our office to schedule the follow-up appointment.  Remote monitoring is used to monitor your  ICD from home. This monitoring reduces the number of office visits required to check your device to one time per year. It allows us to keep an eye on the functioning of your device to ensure it is working properly. You are scheduled for a device check from home on 10/11/15. You may send your transmission at any time that day. If you have a wireless device, the transmission will be sent automatically. After your physician reviews your transmission, you will receive a postcard with your next transmission date.     Any Other Special Instructions Will Be Listed Below (If Applicable).     If you need a refill on your cardiac medications before your next appointment, please call your pharmacy.   

## 2015-08-19 ENCOUNTER — Ambulatory Visit (INDEPENDENT_AMBULATORY_CARE_PROVIDER_SITE_OTHER): Payer: Medicare Other | Admitting: *Deleted

## 2015-08-19 DIAGNOSIS — Z5181 Encounter for therapeutic drug level monitoring: Secondary | ICD-10-CM

## 2015-08-19 DIAGNOSIS — I213 ST elevation (STEMI) myocardial infarction of unspecified site: Secondary | ICD-10-CM

## 2015-08-19 DIAGNOSIS — I513 Intracardiac thrombosis, not elsewhere classified: Secondary | ICD-10-CM

## 2015-08-19 LAB — POCT INR: INR: 2

## 2015-09-30 ENCOUNTER — Ambulatory Visit (INDEPENDENT_AMBULATORY_CARE_PROVIDER_SITE_OTHER): Payer: Medicare Other | Admitting: *Deleted

## 2015-09-30 DIAGNOSIS — Z5181 Encounter for therapeutic drug level monitoring: Secondary | ICD-10-CM

## 2015-09-30 DIAGNOSIS — I213 ST elevation (STEMI) myocardial infarction of unspecified site: Secondary | ICD-10-CM

## 2015-09-30 DIAGNOSIS — I513 Intracardiac thrombosis, not elsewhere classified: Secondary | ICD-10-CM

## 2015-09-30 LAB — POCT INR: INR: 2.6

## 2015-10-10 ENCOUNTER — Other Ambulatory Visit: Payer: Self-pay | Admitting: Internal Medicine

## 2015-10-11 ENCOUNTER — Ambulatory Visit (INDEPENDENT_AMBULATORY_CARE_PROVIDER_SITE_OTHER): Payer: Medicare Other | Admitting: *Deleted

## 2015-10-11 ENCOUNTER — Ambulatory Visit: Payer: Medicare Other | Admitting: *Deleted

## 2015-10-11 ENCOUNTER — Telehealth: Payer: Self-pay | Admitting: Cardiology

## 2015-10-11 DIAGNOSIS — I255 Ischemic cardiomyopathy: Secondary | ICD-10-CM | POA: Diagnosis not present

## 2015-10-11 NOTE — Telephone Encounter (Signed)
Confirmed remote transmission w/ pt wife.   

## 2015-10-14 ENCOUNTER — Encounter: Payer: Self-pay | Admitting: Cardiology

## 2015-10-26 ENCOUNTER — Encounter: Payer: Self-pay | Admitting: Cardiology

## 2015-10-26 NOTE — Progress Notes (Signed)
Remote ICD transmission.   

## 2015-10-27 LAB — CUP PACEART REMOTE DEVICE CHECK
Battery Voltage: 2.95 V
Date Time Interrogation Session: 20170905195615
HighPow Impedance: 44 Ohm
Implantable Lead Implant Date: 20050830
Implantable Lead Location: 753860
Lead Channel Pacing Threshold Amplitude: 1 V
Lead Channel Pacing Threshold Pulse Width: 0.5 ms
Lead Channel Setting Sensing Sensitivity: 0.5 mV
MDC IDC MSMT BATTERY REMAINING LONGEVITY: 62 mo
MDC IDC MSMT BATTERY REMAINING PERCENTAGE: 54 %
MDC IDC MSMT LEADCHNL RV IMPEDANCE VALUE: 400 Ohm
MDC IDC MSMT LEADCHNL RV SENSING INTR AMPL: 11.4 mV
MDC IDC SET LEADCHNL RV PACING AMPLITUDE: 2.5 V
MDC IDC SET LEADCHNL RV PACING PULSEWIDTH: 0.5 ms
MDC IDC STAT BRADY RV PERCENT PACED: 1.7 %
Pulse Gen Serial Number: 788888

## 2015-11-11 ENCOUNTER — Ambulatory Visit (INDEPENDENT_AMBULATORY_CARE_PROVIDER_SITE_OTHER): Payer: Medicare Other | Admitting: Pharmacist

## 2015-11-11 DIAGNOSIS — Z5181 Encounter for therapeutic drug level monitoring: Secondary | ICD-10-CM

## 2015-11-11 DIAGNOSIS — I513 Intracardiac thrombosis, not elsewhere classified: Secondary | ICD-10-CM

## 2015-11-11 LAB — POCT INR: INR: 2.3

## 2015-12-23 ENCOUNTER — Ambulatory Visit (INDEPENDENT_AMBULATORY_CARE_PROVIDER_SITE_OTHER): Payer: Medicare Other | Admitting: Pharmacist Clinician (PhC)/ Clinical Pharmacy Specialist

## 2015-12-23 DIAGNOSIS — Z5181 Encounter for therapeutic drug level monitoring: Secondary | ICD-10-CM

## 2015-12-23 DIAGNOSIS — I513 Intracardiac thrombosis, not elsewhere classified: Secondary | ICD-10-CM

## 2015-12-23 LAB — POCT INR: INR: 2

## 2016-01-17 ENCOUNTER — Telehealth: Payer: Self-pay | Admitting: Internal Medicine

## 2016-01-17 NOTE — Telephone Encounter (Signed)
Dr.Taylor recommended that patient continue Coreg as Rx'd and follow up as scheduled. Patient will need an ROV if he receives anymore shocks.  Wife informed about Dr.Taylor's recommendations and voiced understanding.

## 2016-01-17 NOTE — Telephone Encounter (Signed)
Spoke to wife regarding patient's episodes from today. Wife states that patient was feeling well before and after ICD shock. She said that he has just been under a lot of stress today bc of their car troubles. Patient's wife states that he has been taking his medications as Rx'd. Will inform Dr.Taylor about episode and notify patient/wife with further recommendations.  I also informed wife of 22months driving restriction per DMV. Wife voiced understanding.

## 2016-01-17 NOTE — Telephone Encounter (Signed)
error 

## 2016-01-17 NOTE — Telephone Encounter (Signed)
Pt wife called and stated that pt received a shock and it knocked the patient to the floor.   No shortness of breath No chest pain No dizziness.   Pt wife said that pt says he feels fine. Instructed pt wife to send a remote transmission w/ pt home monitor. Instructed her how to do this. Pt wife stated that it may not come through b/c they have been having issues with their power going off and on today.

## 2016-01-25 ENCOUNTER — Telehealth: Payer: Self-pay | Admitting: Cardiology

## 2016-01-25 ENCOUNTER — Ambulatory Visit (INDEPENDENT_AMBULATORY_CARE_PROVIDER_SITE_OTHER): Payer: Medicare Other | Admitting: *Deleted

## 2016-01-25 DIAGNOSIS — I255 Ischemic cardiomyopathy: Secondary | ICD-10-CM

## 2016-01-25 NOTE — Telephone Encounter (Signed)
LMOVM reminding pt to send remote transmission.   

## 2016-01-25 NOTE — Progress Notes (Signed)
Remote ICD transmission.   

## 2016-02-02 ENCOUNTER — Other Ambulatory Visit: Payer: Self-pay | Admitting: Internal Medicine

## 2016-02-03 LAB — CUP PACEART REMOTE DEVICE CHECK
Battery Remaining Longevity: 59 mo
Battery Remaining Percentage: 51 %
Brady Statistic RV Percent Paced: 1.2 %
Date Time Interrogation Session: 20171220162909
HighPow Impedance: 52 Ohm
Implantable Lead Implant Date: 20050830
Implantable Lead Location: 753860
Implantable Lead Model: 1581
Lead Channel Impedance Value: 390 Ohm
Lead Channel Sensing Intrinsic Amplitude: 12 mV
Lead Channel Setting Pacing Amplitude: 2.5 V
MDC IDC MSMT BATTERY VOLTAGE: 2.95 V
MDC IDC MSMT LEADCHNL RV PACING THRESHOLD AMPLITUDE: 1 V
MDC IDC MSMT LEADCHNL RV PACING THRESHOLD PULSEWIDTH: 0.5 ms
MDC IDC PG IMPLANT DT: 20120402
MDC IDC PG SERIAL: 788888
MDC IDC SET LEADCHNL RV PACING PULSEWIDTH: 0.5 ms
MDC IDC SET LEADCHNL RV SENSING SENSITIVITY: 0.5 mV

## 2016-02-15 ENCOUNTER — Ambulatory Visit (INDEPENDENT_AMBULATORY_CARE_PROVIDER_SITE_OTHER): Payer: Medicare Other | Admitting: *Deleted

## 2016-02-15 DIAGNOSIS — Z5181 Encounter for therapeutic drug level monitoring: Secondary | ICD-10-CM | POA: Diagnosis not present

## 2016-02-15 DIAGNOSIS — I513 Intracardiac thrombosis, not elsewhere classified: Secondary | ICD-10-CM | POA: Diagnosis not present

## 2016-02-15 LAB — POCT INR: INR: 3.3

## 2016-03-05 ENCOUNTER — Telehealth: Payer: Self-pay | Admitting: Internal Medicine

## 2016-03-05 NOTE — Telephone Encounter (Signed)
New Message:   Pt is having problems with his breathing and she can not bring him in. Pt's car is broke down,what can she do?

## 2016-03-05 NOTE — Telephone Encounter (Signed)
Received incoming call from pt's wife, [place] (on [place]). Wife stated pt has been SOB. Asked to speak with pt. Pt came to the phone. (Pt was able to speak clearly and easily, and was not out of breath while speaking) Pt stated SOB with exertion started 4-5 days ago. Pt does not keep daily weights or have a way to obtain. Pt does not have a way to obtain O2 sat from home. Pt states he has transportation issues (car is not in working order) and is concerned about financial cost of visits. Gave billing number to get cost info - [phone]. Informed pt should report to the nearest emergency department or call 911 if symptoms don't improve or get worse. Made appt tomorrow with Dr. Ladona Ridgel at 2:45 PM (arriving at 2:30). Pt stated he may have to cancel appt if he can't get a ride. Pt verbalized understanding.

## 2016-03-06 ENCOUNTER — Ambulatory Visit: Payer: Medicare Other | Admitting: Internal Medicine

## 2016-03-07 ENCOUNTER — Other Ambulatory Visit: Payer: Self-pay | Admitting: Internal Medicine

## 2016-03-11 DIAGNOSIS — E785 Hyperlipidemia, unspecified: Secondary | ICD-10-CM | POA: Diagnosis not present

## 2016-03-11 DIAGNOSIS — Z95810 Presence of automatic (implantable) cardiac defibrillator: Secondary | ICD-10-CM | POA: Diagnosis not present

## 2016-03-11 DIAGNOSIS — I513 Intracardiac thrombosis, not elsewhere classified: Secondary | ICD-10-CM | POA: Diagnosis not present

## 2016-03-11 DIAGNOSIS — I4510 Unspecified right bundle-branch block: Secondary | ICD-10-CM | POA: Diagnosis not present

## 2016-03-11 DIAGNOSIS — I517 Cardiomegaly: Secondary | ICD-10-CM | POA: Diagnosis not present

## 2016-03-11 DIAGNOSIS — I519 Heart disease, unspecified: Secondary | ICD-10-CM | POA: Diagnosis not present

## 2016-03-11 DIAGNOSIS — I249 Acute ischemic heart disease, unspecified: Secondary | ICD-10-CM | POA: Diagnosis not present

## 2016-03-11 DIAGNOSIS — Z4502 Encounter for adjustment and management of automatic implantable cardiac defibrillator: Secondary | ICD-10-CM | POA: Diagnosis not present

## 2016-03-11 DIAGNOSIS — I5023 Acute on chronic systolic (congestive) heart failure: Secondary | ICD-10-CM | POA: Diagnosis not present

## 2016-03-11 DIAGNOSIS — E119 Type 2 diabetes mellitus without complications: Secondary | ICD-10-CM | POA: Diagnosis not present

## 2016-03-11 DIAGNOSIS — Z87891 Personal history of nicotine dependence: Secondary | ICD-10-CM | POA: Diagnosis not present

## 2016-03-11 DIAGNOSIS — I4949 Other premature depolarization: Secondary | ICD-10-CM | POA: Diagnosis not present

## 2016-03-11 DIAGNOSIS — Z7982 Long term (current) use of aspirin: Secondary | ICD-10-CM | POA: Diagnosis not present

## 2016-03-11 DIAGNOSIS — Z955 Presence of coronary angioplasty implant and graft: Secondary | ICD-10-CM | POA: Diagnosis not present

## 2016-03-11 DIAGNOSIS — R0602 Shortness of breath: Secondary | ICD-10-CM | POA: Diagnosis not present

## 2016-03-11 DIAGNOSIS — N179 Acute kidney failure, unspecified: Secondary | ICD-10-CM | POA: Diagnosis not present

## 2016-03-11 DIAGNOSIS — Z7901 Long term (current) use of anticoagulants: Secondary | ICD-10-CM | POA: Diagnosis not present

## 2016-03-11 DIAGNOSIS — I255 Ischemic cardiomyopathy: Secondary | ICD-10-CM | POA: Diagnosis not present

## 2016-03-11 DIAGNOSIS — I340 Nonrheumatic mitral (valve) insufficiency: Secondary | ICD-10-CM | POA: Diagnosis not present

## 2016-03-11 DIAGNOSIS — I2510 Atherosclerotic heart disease of native coronary artery without angina pectoris: Secondary | ICD-10-CM | POA: Diagnosis not present

## 2016-03-11 DIAGNOSIS — Z79899 Other long term (current) drug therapy: Secondary | ICD-10-CM | POA: Diagnosis not present

## 2016-03-11 DIAGNOSIS — I10 Essential (primary) hypertension: Secondary | ICD-10-CM | POA: Diagnosis not present

## 2016-03-11 DIAGNOSIS — I5020 Unspecified systolic (congestive) heart failure: Secondary | ICD-10-CM | POA: Diagnosis not present

## 2016-03-11 DIAGNOSIS — J449 Chronic obstructive pulmonary disease, unspecified: Secondary | ICD-10-CM | POA: Diagnosis not present

## 2016-03-11 DIAGNOSIS — I361 Nonrheumatic tricuspid (valve) insufficiency: Secondary | ICD-10-CM | POA: Diagnosis not present

## 2016-03-11 DIAGNOSIS — I252 Old myocardial infarction: Secondary | ICD-10-CM | POA: Diagnosis not present

## 2016-03-12 DIAGNOSIS — I5020 Unspecified systolic (congestive) heart failure: Secondary | ICD-10-CM | POA: Diagnosis not present

## 2016-03-12 DIAGNOSIS — I2510 Atherosclerotic heart disease of native coronary artery without angina pectoris: Secondary | ICD-10-CM | POA: Diagnosis not present

## 2016-03-12 DIAGNOSIS — I517 Cardiomegaly: Secondary | ICD-10-CM | POA: Diagnosis not present

## 2016-03-12 DIAGNOSIS — I519 Heart disease, unspecified: Secondary | ICD-10-CM | POA: Diagnosis not present

## 2016-03-12 DIAGNOSIS — I340 Nonrheumatic mitral (valve) insufficiency: Secondary | ICD-10-CM | POA: Diagnosis not present

## 2016-03-12 DIAGNOSIS — R0602 Shortness of breath: Secondary | ICD-10-CM | POA: Diagnosis not present

## 2016-03-12 DIAGNOSIS — Z95810 Presence of automatic (implantable) cardiac defibrillator: Secondary | ICD-10-CM | POA: Diagnosis not present

## 2016-03-12 DIAGNOSIS — I361 Nonrheumatic tricuspid (valve) insufficiency: Secondary | ICD-10-CM | POA: Diagnosis not present

## 2016-03-12 DIAGNOSIS — Z4502 Encounter for adjustment and management of automatic implantable cardiac defibrillator: Secondary | ICD-10-CM | POA: Diagnosis not present

## 2016-03-12 DIAGNOSIS — I513 Intracardiac thrombosis, not elsewhere classified: Secondary | ICD-10-CM | POA: Diagnosis not present

## 2016-03-13 DIAGNOSIS — I5023 Acute on chronic systolic (congestive) heart failure: Secondary | ICD-10-CM | POA: Diagnosis not present

## 2016-03-13 DIAGNOSIS — I2510 Atherosclerotic heart disease of native coronary artery without angina pectoris: Secondary | ICD-10-CM | POA: Diagnosis not present

## 2016-03-13 DIAGNOSIS — N179 Acute kidney failure, unspecified: Secondary | ICD-10-CM | POA: Diagnosis not present

## 2016-03-13 DIAGNOSIS — I513 Intracardiac thrombosis, not elsewhere classified: Secondary | ICD-10-CM | POA: Diagnosis not present

## 2016-03-21 ENCOUNTER — Telehealth: Payer: Self-pay | Admitting: *Deleted

## 2016-03-21 NOTE — Telephone Encounter (Signed)
Spoke with pt's wife and she states  been in the hospital at [facility] from 03/11/2016 to 03/14/2016 and that she knows needs to have INR checked but has no transportation. Instructed pt's wife that we do need to check his INR and she states may have way to get there on Monday so appt made for that day

## 2016-03-26 ENCOUNTER — Ambulatory Visit (INDEPENDENT_AMBULATORY_CARE_PROVIDER_SITE_OTHER): Payer: Medicare Other | Admitting: *Deleted

## 2016-03-26 DIAGNOSIS — Z5181 Encounter for therapeutic drug level monitoring: Secondary | ICD-10-CM

## 2016-03-26 DIAGNOSIS — I513 Intracardiac thrombosis, not elsewhere classified: Secondary | ICD-10-CM

## 2016-03-26 LAB — POCT INR: INR: 2.4

## 2016-04-24 ENCOUNTER — Ambulatory Visit (INDEPENDENT_AMBULATORY_CARE_PROVIDER_SITE_OTHER): Payer: Medicare Other

## 2016-04-24 DIAGNOSIS — Z5181 Encounter for therapeutic drug level monitoring: Secondary | ICD-10-CM | POA: Diagnosis not present

## 2016-04-24 DIAGNOSIS — I513 Intracardiac thrombosis, not elsewhere classified: Secondary | ICD-10-CM

## 2016-04-24 LAB — POCT INR: INR: 2.2

## 2016-04-25 ENCOUNTER — Ambulatory Visit (INDEPENDENT_AMBULATORY_CARE_PROVIDER_SITE_OTHER): Payer: Medicare Other | Admitting: *Deleted

## 2016-04-25 DIAGNOSIS — I255 Ischemic cardiomyopathy: Secondary | ICD-10-CM | POA: Diagnosis not present

## 2016-04-25 NOTE — Progress Notes (Signed)
Remote ICD transmission.   

## 2016-04-26 ENCOUNTER — Encounter: Payer: Self-pay | Admitting: Cardiology

## 2016-04-30 LAB — CUP PACEART REMOTE DEVICE CHECK
Battery Remaining Percentage: 49 %
Date Time Interrogation Session: 20180321083357
HighPow Impedance: 50 Ohm
Implantable Lead Implant Date: 20050830
Implantable Lead Location: 753860
Implantable Pulse Generator Implant Date: 20120402
Lead Channel Pacing Threshold Amplitude: 0.25 V
Lead Channel Pacing Threshold Pulse Width: 0.5 ms
Lead Channel Setting Pacing Amplitude: 2.5 V
Lead Channel Setting Sensing Sensitivity: 0.5 mV
MDC IDC MSMT BATTERY REMAINING LONGEVITY: 56 mo
MDC IDC MSMT BATTERY VOLTAGE: 2.93 V
MDC IDC MSMT LEADCHNL RV IMPEDANCE VALUE: 360 Ohm
MDC IDC MSMT LEADCHNL RV SENSING INTR AMPL: 11.4 mV
MDC IDC SET LEADCHNL RV PACING PULSEWIDTH: 0.5 ms
MDC IDC STAT BRADY RV PERCENT PACED: 1.1 %
Pulse Gen Serial Number: 788888

## 2016-06-07 ENCOUNTER — Ambulatory Visit (INDEPENDENT_AMBULATORY_CARE_PROVIDER_SITE_OTHER): Payer: Medicare Other | Admitting: *Deleted

## 2016-06-07 DIAGNOSIS — Z5181 Encounter for therapeutic drug level monitoring: Secondary | ICD-10-CM

## 2016-06-07 DIAGNOSIS — I513 Intracardiac thrombosis, not elsewhere classified: Secondary | ICD-10-CM

## 2016-06-07 LAB — POCT INR: INR: 3.2

## 2016-06-25 ENCOUNTER — Encounter (INDEPENDENT_AMBULATORY_CARE_PROVIDER_SITE_OTHER): Payer: Self-pay

## 2016-06-25 ENCOUNTER — Ambulatory Visit (INDEPENDENT_AMBULATORY_CARE_PROVIDER_SITE_OTHER): Payer: Medicare Other | Admitting: *Deleted

## 2016-06-25 DIAGNOSIS — I513 Intracardiac thrombosis, not elsewhere classified: Secondary | ICD-10-CM

## 2016-06-25 DIAGNOSIS — Z5181 Encounter for therapeutic drug level monitoring: Secondary | ICD-10-CM

## 2016-06-25 LAB — POCT INR: INR: 2.8

## 2016-07-14 DIAGNOSIS — K219 Gastro-esophageal reflux disease without esophagitis: Secondary | ICD-10-CM | POA: Diagnosis not present

## 2016-07-14 DIAGNOSIS — J189 Pneumonia, unspecified organism: Secondary | ICD-10-CM | POA: Diagnosis not present

## 2016-07-14 DIAGNOSIS — R0902 Hypoxemia: Secondary | ICD-10-CM | POA: Diagnosis not present

## 2016-07-14 DIAGNOSIS — R0602 Shortness of breath: Secondary | ICD-10-CM | POA: Diagnosis not present

## 2016-07-14 DIAGNOSIS — I509 Heart failure, unspecified: Secondary | ICD-10-CM | POA: Diagnosis not present

## 2016-07-14 DIAGNOSIS — J449 Chronic obstructive pulmonary disease, unspecified: Secondary | ICD-10-CM | POA: Diagnosis not present

## 2016-07-14 DIAGNOSIS — I2510 Atherosclerotic heart disease of native coronary artery without angina pectoris: Secondary | ICD-10-CM | POA: Diagnosis not present

## 2016-07-14 DIAGNOSIS — I110 Hypertensive heart disease with heart failure: Secondary | ICD-10-CM | POA: Diagnosis not present

## 2016-07-14 DIAGNOSIS — E119 Type 2 diabetes mellitus without complications: Secondary | ICD-10-CM | POA: Diagnosis not present

## 2016-07-14 DIAGNOSIS — D72829 Elevated white blood cell count, unspecified: Secondary | ICD-10-CM | POA: Diagnosis not present

## 2016-07-15 DIAGNOSIS — J189 Pneumonia, unspecified organism: Secondary | ICD-10-CM | POA: Diagnosis not present

## 2016-07-15 DIAGNOSIS — I509 Heart failure, unspecified: Secondary | ICD-10-CM | POA: Diagnosis not present

## 2016-07-15 DIAGNOSIS — R0602 Shortness of breath: Secondary | ICD-10-CM | POA: Diagnosis not present

## 2016-07-15 DIAGNOSIS — R0902 Hypoxemia: Secondary | ICD-10-CM | POA: Diagnosis not present

## 2016-07-15 DIAGNOSIS — I110 Hypertensive heart disease with heart failure: Secondary | ICD-10-CM | POA: Diagnosis not present

## 2016-07-19 ENCOUNTER — Encounter: Payer: Self-pay | Admitting: Internal Medicine

## 2016-07-19 ENCOUNTER — Ambulatory Visit (INDEPENDENT_AMBULATORY_CARE_PROVIDER_SITE_OTHER): Payer: Medicare Other | Admitting: *Deleted

## 2016-07-19 ENCOUNTER — Ambulatory Visit (INDEPENDENT_AMBULATORY_CARE_PROVIDER_SITE_OTHER): Payer: Medicare Other | Admitting: Internal Medicine

## 2016-07-19 VITALS — BP 90/60 | HR 51 | Ht 69.5 in | Wt 171.8 lb

## 2016-07-19 DIAGNOSIS — I513 Intracardiac thrombosis, not elsewhere classified: Secondary | ICD-10-CM | POA: Diagnosis not present

## 2016-07-19 DIAGNOSIS — I5022 Chronic systolic (congestive) heart failure: Secondary | ICD-10-CM | POA: Diagnosis not present

## 2016-07-19 DIAGNOSIS — Z5181 Encounter for therapeutic drug level monitoring: Secondary | ICD-10-CM

## 2016-07-19 DIAGNOSIS — I255 Ischemic cardiomyopathy: Secondary | ICD-10-CM

## 2016-07-19 DIAGNOSIS — Z95810 Presence of automatic (implantable) cardiac defibrillator: Secondary | ICD-10-CM

## 2016-07-19 DIAGNOSIS — I472 Ventricular tachycardia, unspecified: Secondary | ICD-10-CM

## 2016-07-19 LAB — CUP PACEART INCLINIC DEVICE CHECK
Battery Remaining Longevity: 54 mo
HighPow Impedance: 47.3886
Implantable Lead Implant Date: 20050830
Implantable Pulse Generator Implant Date: 20120402
Lead Channel Pacing Threshold Amplitude: 0.75 V
Lead Channel Pacing Threshold Pulse Width: 0.5 ms
MDC IDC LEAD LOCATION: 753860
MDC IDC MSMT LEADCHNL RV IMPEDANCE VALUE: 425 Ohm
MDC IDC MSMT LEADCHNL RV SENSING INTR AMPL: 12 mV
MDC IDC PG SERIAL: 788888
MDC IDC SESS DTM: 20180614151126
MDC IDC SET LEADCHNL RV PACING AMPLITUDE: 2.5 V
MDC IDC SET LEADCHNL RV PACING PULSEWIDTH: 0.5 ms
MDC IDC SET LEADCHNL RV SENSING SENSITIVITY: 0.5 mV
MDC IDC STAT BRADY RV PERCENT PACED: 1 %

## 2016-07-19 LAB — POCT INR: INR: 2.4

## 2016-07-19 MED ORDER — FUROSEMIDE 40 MG PO TABS: 40.0000 mg | ORAL_TABLET | Freq: Every day | ORAL | 11 refills | Status: DC

## 2016-07-19 NOTE — Patient Instructions (Signed)
Medication Instructions:  Your physician recommends that you continue on your current medications as directed. Please refer to the Current Medication list given to you today.   Labwork: None Ordered   Testing/Procedures: None Ordered   Follow-Up: Your physician wants you to follow-up in: 1 year with Dr. Taylor. You will receive a reminder letter in the mail two months in advance. If you don't receive a letter, please call our office to schedule the follow-up appointment.  Remote monitoring is used to monitor your  ICD from home. This monitoring reduces the number of office visits required to check your device to one time per year. It allows us to keep an eye on the functioning of your device to ensure it is working properly. You are scheduled for a device check from home on  10/18/16 . You may send your transmission at any time that day. If you have a wireless device, the transmission will be sent automatically. After your physician reviews your transmission, you will receive a postcard with your next transmission date.    Any Other Special Instructions Will Be Listed Below (If Applicable).     If you need a refill on your cardiac medications before your next appointment, please call your pharmacy.   

## 2016-07-19 NOTE — Progress Notes (Signed)
HPI Mr. Tierno returns today for ICD followup. He is a pleasant 82 yo man with an ICM, VT, chronic systolic heart failure and is s/p ICD implant. In the interim, he has developed VT with 2 different episodes with one successfully pace terminated and the other shock terminated. He did not seek medical attention. 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  No Known Allergies   Current Outpatient Prescriptions  Medication Sig Dispense Refill  . albuterol (PROVENTIL HFA;VENTOLIN HFA) 108 (90 Base) MCG/ACT inhaler Inhale 2 puffs into the lungs every 4 (four) hours as needed for shortness of breath.    Marland Kitchen aspirin 81 MG tablet Take 81 mg by mouth at bedtime.     Marland Kitchen atorvastatin (LIPITOR) 20 MG tablet Take 10 mg by mouth daily.     . carvedilol (COREG) 3.125 MG tablet Take 3.125 mg by mouth 2 (two) times daily with a meal.      . cholecalciferol (VITAMIN D) 1000 UNITS tablet Take 1,000 Units by mouth daily.    . furosemide (LASIX) 40 MG tablet Take 1 tablet (40 mg total) by mouth daily. 30 tablet 6  . lisinopril (PRINIVIL,ZESTRIL) 20 MG tablet Take 0.5 tablets (10 mg total) by mouth daily.    . niacin (NIASPAN) 1000 MG CR tablet Take 1,000 mg by mouth at bedtime.     . nitroGLYCERIN (NITROSTAT) 0.4 MG SL tablet Place 1 tablet (0.4 mg total) under the tongue every 5 (five) hours as needed for chest pain (Max 3 tablets in 15 minutes). 25 tablet 3  . omeprazole (PRILOSEC) 20 MG capsule Take 10 mg by mouth daily.     Marland Kitchen warfarin (COUMADIN) 5 MG tablet TAKE AS DIRECTED BY COUMADIN CLINIC 30 tablet 3   No current facility-administered medications for this visit.      Past Medical History:  Diagnosis Date  . AICD (automatic cardioverter/defibrillator) present   . Apical mural thrombus 03/23/2013   a. Echo (03/23/13): Mod LV dysfunction, Gr 1 DD, probable apical clot;  b. TEE (03/24/13): EF 20-25%, poss small calcified apical clot, trivial MR, grade IV  plaque in aortic arch   . CAD (coronary artery disease)    a. s/p  MI in 1992 tx with POBA of LAD c/b reocclusion and recanalization; s/p stent to RCA;  b. Cardiolite (01/2000): Apical, ant, ant-septal scar, no ischemia; EF 27%;  c. LHC (10/2006): pLAD 40, pRCA stent 40 ISR, dRCA 30 ISR; EF 35%   . Chronic systolic CHF (congestive heart failure) (HCC)   . CKD (chronic kidney disease)   . HLD (hyperlipidemia)   . HTN (hypertension)   . Ischemic cardiomyopathy    EF 20-25%  . Presence of permanent cardiac pacemaker   . Ventricular tachycardia (HCC)     ROS:   All systems reviewed and negative except as noted in the HPI.   Past Surgical History:  Procedure Laterality Date  . ANGIOPLASTY / STENTING FEMORAL    . CARDIAC DEFIBRILLATOR PLACEMENT     ICD-St Jude  . TEE WITHOUT CARDIOVERSION N/A 03/24/2013   Procedure: TRANSESOPHAGEAL ECHOCARDIOGRAM (TEE);  Surgeon: Laurey Morale, MD;  Location: [facility] ENDOSCOPY;  Service: Cardiovascular;  Laterality: N/A;     Family History  Problem Relation Age of Onset  . Diabetes Mother   . AAA (abdominal aortic aneurysm) Father   . Heart attack Neg Hx   . Stroke Neg Hx      Social History  Social History  . Marital status: Married    Spouse name: N/A  . Number of children: N/A  . Years of education: N/A   Occupational History  . Not on file.   Social History Main Topics  . Smoking status: Former Smoker    Types: Cigarettes    Quit date: 03/21/1990  . Smokeless tobacco: Never Used  . Alcohol use No  . Drug use: No  . Sexual activity: Not on file   Other Topics Concern  . Not on file   Social History Narrative  . No narrative on file     BP 90/60   Pulse (!) 51   Ht 5' 9.5" (1.765 m)   Wt 171 lb 12.8 oz (77.9 kg)   SpO2 94%   BMI 25.01 kg/m   Physical Exam:  Well appearing 82 yo woman, NAD HEENT: Unremarkable Neck:  6 cm JVD, no thyromegally Back:  No CVA tenderness Lungs:  Clear with no wheezes HEART:  Regular  rate rhythm, no murmurs, no rubs, no clicks Abd:  soft, positive bowel sounds, no organomegally, no rebound, no guarding Ext:  2 plus pulses, no edema, no cyanosis, no clubbing Skin:  No rashes no nodules Neuro:  CN II through XII intact, motor grossly intact  DEVICE  Normal device function.  See PaceArt for details.   Assess/Plan: 1. VT - he has had 2 episodes since his last visit. No change in meds. 2. Chronic systolic heart failure - his symptoms are class 2. No change in meds. 3. ICD - his St. Jude device demonstrates some variability with his shocking impedence. We will watch this very carefully. I would be inclined to place a new ICD lead if possible rather than perform extraction if his lead begins to show signs of failure. 4. CAD - he denies anginal symptoms. Will follow.  Leonia Reeves.D.

## 2016-08-16 ENCOUNTER — Ambulatory Visit (INDEPENDENT_AMBULATORY_CARE_PROVIDER_SITE_OTHER): Payer: Medicare Other | Admitting: *Deleted

## 2016-08-16 DIAGNOSIS — I513 Intracardiac thrombosis, not elsewhere classified: Secondary | ICD-10-CM | POA: Diagnosis not present

## 2016-08-16 DIAGNOSIS — Z5181 Encounter for therapeutic drug level monitoring: Secondary | ICD-10-CM

## 2016-08-16 LAB — POCT INR: INR: 3.3

## 2016-08-17 ENCOUNTER — Other Ambulatory Visit: Payer: Self-pay | Admitting: Internal Medicine

## 2016-09-13 ENCOUNTER — Ambulatory Visit (INDEPENDENT_AMBULATORY_CARE_PROVIDER_SITE_OTHER): Payer: Medicare Other | Admitting: *Deleted

## 2016-09-13 DIAGNOSIS — Z5181 Encounter for therapeutic drug level monitoring: Secondary | ICD-10-CM | POA: Diagnosis not present

## 2016-09-13 DIAGNOSIS — I513 Intracardiac thrombosis, not elsewhere classified: Secondary | ICD-10-CM | POA: Diagnosis not present

## 2016-09-13 LAB — POCT INR: INR: 2.2

## 2016-10-11 ENCOUNTER — Ambulatory Visit (INDEPENDENT_AMBULATORY_CARE_PROVIDER_SITE_OTHER): Payer: Medicare Other | Admitting: *Deleted

## 2016-10-11 DIAGNOSIS — I513 Intracardiac thrombosis, not elsewhere classified: Secondary | ICD-10-CM | POA: Diagnosis not present

## 2016-10-11 DIAGNOSIS — Z5181 Encounter for therapeutic drug level monitoring: Secondary | ICD-10-CM | POA: Diagnosis not present

## 2016-10-11 LAB — POCT INR: INR: 2.6

## 2016-10-18 ENCOUNTER — Ambulatory Visit (INDEPENDENT_AMBULATORY_CARE_PROVIDER_SITE_OTHER): Payer: Medicare Other | Admitting: *Deleted

## 2016-10-18 DIAGNOSIS — I255 Ischemic cardiomyopathy: Secondary | ICD-10-CM

## 2016-10-18 DIAGNOSIS — I472 Ventricular tachycardia, unspecified: Secondary | ICD-10-CM

## 2016-10-18 NOTE — Progress Notes (Signed)
Remote ICD transmission.   

## 2016-10-19 ENCOUNTER — Encounter: Payer: Self-pay | Admitting: Cardiology

## 2016-10-29 LAB — CUP PACEART REMOTE DEVICE CHECK
Brady Statistic RV Percent Paced: 1 %
HIGH POWER IMPEDANCE MEASURED VALUE: 40 Ohm
Implantable Lead Implant Date: 20050830
Lead Channel Impedance Value: 360 Ohm
Lead Channel Pacing Threshold Amplitude: 0.75 V
Lead Channel Pacing Threshold Pulse Width: 0.5 ms
Lead Channel Sensing Intrinsic Amplitude: 12 mV
MDC IDC LEAD LOCATION: 753860
MDC IDC MSMT BATTERY REMAINING LONGEVITY: 52 mo
MDC IDC MSMT BATTERY REMAINING PERCENTAGE: 45 %
MDC IDC MSMT BATTERY VOLTAGE: 2.93 V
MDC IDC PG IMPLANT DT: 20120402
MDC IDC SESS DTM: 20180913093024
MDC IDC SET LEADCHNL RV PACING AMPLITUDE: 2.5 V
MDC IDC SET LEADCHNL RV PACING PULSEWIDTH: 0.5 ms
MDC IDC SET LEADCHNL RV SENSING SENSITIVITY: 0.5 mV
Pulse Gen Serial Number: 788888

## 2016-11-08 ENCOUNTER — Ambulatory Visit (INDEPENDENT_AMBULATORY_CARE_PROVIDER_SITE_OTHER): Payer: Medicare Other | Admitting: *Deleted

## 2016-11-08 DIAGNOSIS — I513 Intracardiac thrombosis, not elsewhere classified: Secondary | ICD-10-CM

## 2016-11-08 DIAGNOSIS — Z5181 Encounter for therapeutic drug level monitoring: Secondary | ICD-10-CM

## 2016-11-08 LAB — POCT INR: INR: 3

## 2016-11-23 ENCOUNTER — Inpatient Hospital Stay (HOSPITAL_COMMUNITY): Admission: AD | Admit: 2016-11-23 | Payer: Self-pay | Source: Other Acute Inpatient Hospital | Admitting: Cardiology

## 2016-11-23 DIAGNOSIS — R0602 Shortness of breath: Secondary | ICD-10-CM | POA: Diagnosis not present

## 2016-11-23 DIAGNOSIS — R7989 Other specified abnormal findings of blood chemistry: Secondary | ICD-10-CM | POA: Diagnosis not present

## 2016-11-23 DIAGNOSIS — I130 Hypertensive heart and chronic kidney disease with heart failure and stage 1 through stage 4 chronic kidney disease, or unspecified chronic kidney disease: Secondary | ICD-10-CM | POA: Diagnosis not present

## 2016-11-23 DIAGNOSIS — E1122 Type 2 diabetes mellitus with diabetic chronic kidney disease: Secondary | ICD-10-CM | POA: Diagnosis not present

## 2016-11-23 DIAGNOSIS — J449 Chronic obstructive pulmonary disease, unspecified: Secondary | ICD-10-CM | POA: Diagnosis not present

## 2016-11-23 DIAGNOSIS — I509 Heart failure, unspecified: Secondary | ICD-10-CM | POA: Diagnosis not present

## 2016-11-23 DIAGNOSIS — N189 Chronic kidney disease, unspecified: Secondary | ICD-10-CM | POA: Diagnosis not present

## 2016-11-23 DIAGNOSIS — I214 Non-ST elevation (NSTEMI) myocardial infarction: Secondary | ICD-10-CM | POA: Diagnosis not present

## 2016-11-23 DIAGNOSIS — J60 Coalworker's pneumoconiosis: Secondary | ICD-10-CM | POA: Diagnosis not present

## 2016-11-23 DIAGNOSIS — I4510 Unspecified right bundle-branch block: Secondary | ICD-10-CM | POA: Diagnosis not present

## 2016-11-23 DIAGNOSIS — R069 Unspecified abnormalities of breathing: Secondary | ICD-10-CM | POA: Diagnosis not present

## 2016-11-23 DIAGNOSIS — I5023 Acute on chronic systolic (congestive) heart failure: Secondary | ICD-10-CM | POA: Diagnosis not present

## 2016-11-23 DIAGNOSIS — I491 Atrial premature depolarization: Secondary | ICD-10-CM | POA: Diagnosis not present

## 2016-11-23 DIAGNOSIS — I2510 Atherosclerotic heart disease of native coronary artery without angina pectoris: Secondary | ICD-10-CM | POA: Diagnosis not present

## 2016-11-23 DIAGNOSIS — N179 Acute kidney failure, unspecified: Secondary | ICD-10-CM | POA: Diagnosis not present

## 2016-11-23 DIAGNOSIS — I110 Hypertensive heart disease with heart failure: Secondary | ICD-10-CM | POA: Diagnosis not present

## 2016-11-23 DIAGNOSIS — I5043 Acute on chronic combined systolic (congestive) and diastolic (congestive) heart failure: Secondary | ICD-10-CM | POA: Diagnosis not present

## 2016-11-23 DIAGNOSIS — E1165 Type 2 diabetes mellitus with hyperglycemia: Secondary | ICD-10-CM | POA: Diagnosis not present

## 2016-11-23 DIAGNOSIS — J441 Chronic obstructive pulmonary disease with (acute) exacerbation: Secondary | ICD-10-CM | POA: Diagnosis not present

## 2016-11-23 DIAGNOSIS — E119 Type 2 diabetes mellitus without complications: Secondary | ICD-10-CM | POA: Diagnosis not present

## 2016-11-23 DIAGNOSIS — I21A1 Myocardial infarction type 2: Secondary | ICD-10-CM | POA: Diagnosis not present

## 2016-11-23 DIAGNOSIS — I493 Ventricular premature depolarization: Secondary | ICD-10-CM | POA: Diagnosis not present

## 2016-11-24 DIAGNOSIS — J449 Chronic obstructive pulmonary disease, unspecified: Secondary | ICD-10-CM | POA: Diagnosis not present

## 2016-11-24 DIAGNOSIS — R7989 Other specified abnormal findings of blood chemistry: Secondary | ICD-10-CM | POA: Diagnosis not present

## 2016-11-24 DIAGNOSIS — I509 Heart failure, unspecified: Secondary | ICD-10-CM | POA: Diagnosis not present

## 2016-11-24 DIAGNOSIS — N189 Chronic kidney disease, unspecified: Secondary | ICD-10-CM | POA: Diagnosis not present

## 2016-11-25 DIAGNOSIS — J449 Chronic obstructive pulmonary disease, unspecified: Secondary | ICD-10-CM | POA: Diagnosis not present

## 2016-11-25 DIAGNOSIS — R7989 Other specified abnormal findings of blood chemistry: Secondary | ICD-10-CM | POA: Diagnosis not present

## 2016-11-25 DIAGNOSIS — N189 Chronic kidney disease, unspecified: Secondary | ICD-10-CM | POA: Diagnosis not present

## 2016-11-25 DIAGNOSIS — I509 Heart failure, unspecified: Secondary | ICD-10-CM | POA: Diagnosis not present

## 2016-11-26 DIAGNOSIS — E119 Type 2 diabetes mellitus without complications: Secondary | ICD-10-CM | POA: Diagnosis not present

## 2016-11-26 DIAGNOSIS — I509 Heart failure, unspecified: Secondary | ICD-10-CM | POA: Diagnosis not present

## 2016-11-26 DIAGNOSIS — J449 Chronic obstructive pulmonary disease, unspecified: Secondary | ICD-10-CM | POA: Diagnosis not present

## 2016-11-26 DIAGNOSIS — N189 Chronic kidney disease, unspecified: Secondary | ICD-10-CM | POA: Diagnosis not present

## 2016-11-27 DIAGNOSIS — N189 Chronic kidney disease, unspecified: Secondary | ICD-10-CM | POA: Diagnosis not present

## 2016-11-27 DIAGNOSIS — E1165 Type 2 diabetes mellitus with hyperglycemia: Secondary | ICD-10-CM | POA: Diagnosis not present

## 2016-11-27 DIAGNOSIS — J441 Chronic obstructive pulmonary disease with (acute) exacerbation: Secondary | ICD-10-CM | POA: Diagnosis not present

## 2016-11-27 DIAGNOSIS — I509 Heart failure, unspecified: Secondary | ICD-10-CM | POA: Diagnosis not present

## 2016-12-13 ENCOUNTER — Ambulatory Visit (INDEPENDENT_AMBULATORY_CARE_PROVIDER_SITE_OTHER): Payer: Medicare Other | Admitting: Pharmacist

## 2016-12-13 DIAGNOSIS — I513 Intracardiac thrombosis, not elsewhere classified: Secondary | ICD-10-CM

## 2016-12-13 DIAGNOSIS — Z5181 Encounter for therapeutic drug level monitoring: Secondary | ICD-10-CM

## 2016-12-13 LAB — POCT INR: INR: 2.2

## 2017-01-17 ENCOUNTER — Ambulatory Visit (INDEPENDENT_AMBULATORY_CARE_PROVIDER_SITE_OTHER): Payer: Medicare Other | Admitting: *Deleted

## 2017-01-17 ENCOUNTER — Ambulatory Visit (INDEPENDENT_AMBULATORY_CARE_PROVIDER_SITE_OTHER): Payer: Medicare Other | Admitting: Pharmacist

## 2017-01-17 DIAGNOSIS — I472 Ventricular tachycardia, unspecified: Secondary | ICD-10-CM

## 2017-01-17 DIAGNOSIS — Z5181 Encounter for therapeutic drug level monitoring: Secondary | ICD-10-CM | POA: Diagnosis not present

## 2017-01-17 DIAGNOSIS — I513 Intracardiac thrombosis, not elsewhere classified: Secondary | ICD-10-CM

## 2017-01-17 LAB — POCT INR: INR: 2.3

## 2017-01-17 NOTE — Progress Notes (Signed)
Remote ICD transmission.   

## 2017-01-17 NOTE — Patient Instructions (Signed)
Description   Continue on same dosage 1/2 tablet daily except 1 tablet on Mondays.  Recheck INR in 6 weeks.  Call if placed on any new medications or scheduled for any procedures # [phone] [facility]      

## 2017-01-22 ENCOUNTER — Encounter: Payer: Self-pay | Admitting: Cardiology

## 2017-02-15 LAB — CUP PACEART REMOTE DEVICE CHECK
Battery Remaining Percentage: 43 %
Date Time Interrogation Session: 20181213090030
HighPow Impedance: 42 Ohm
Implantable Lead Implant Date: 20050830
Implantable Lead Location: 753860
Lead Channel Pacing Threshold Amplitude: 0.75 V
Lead Channel Pacing Threshold Pulse Width: 0.5 ms
Lead Channel Sensing Intrinsic Amplitude: 12 mV
Lead Channel Setting Pacing Amplitude: 2.5 V
Lead Channel Setting Sensing Sensitivity: 0.5 mV
MDC IDC MSMT BATTERY REMAINING LONGEVITY: 49 mo
MDC IDC MSMT BATTERY VOLTAGE: 2.93 V
MDC IDC MSMT LEADCHNL RV IMPEDANCE VALUE: 380 Ohm
MDC IDC PG IMPLANT DT: 20120402
MDC IDC SET LEADCHNL RV PACING PULSEWIDTH: 0.5 ms
MDC IDC STAT BRADY RV PERCENT PACED: 1 %
Pulse Gen Serial Number: 788888

## 2017-02-28 ENCOUNTER — Other Ambulatory Visit: Payer: Self-pay | Admitting: Internal Medicine

## 2017-02-28 ENCOUNTER — Ambulatory Visit (INDEPENDENT_AMBULATORY_CARE_PROVIDER_SITE_OTHER): Payer: Medicare Other | Admitting: *Deleted

## 2017-02-28 ENCOUNTER — Encounter (INDEPENDENT_AMBULATORY_CARE_PROVIDER_SITE_OTHER): Payer: Self-pay

## 2017-02-28 DIAGNOSIS — I513 Intracardiac thrombosis, not elsewhere classified: Secondary | ICD-10-CM | POA: Diagnosis not present

## 2017-02-28 DIAGNOSIS — Z5181 Encounter for therapeutic drug level monitoring: Secondary | ICD-10-CM | POA: Diagnosis not present

## 2017-02-28 LAB — POCT INR: INR: 2.8

## 2017-02-28 NOTE — Patient Instructions (Signed)
Description   Continue on same dosage 1/2 tablet daily except 1 tablet on Mondays.  Recheck INR in 6 weeks.  Call if placed on any new medications or scheduled for any procedures # [phone] [facility]

## 2017-03-13 ENCOUNTER — Telehealth: Payer: Self-pay | Admitting: Internal Medicine

## 2017-03-13 NOTE — Telephone Encounter (Signed)
New Message    *STAT* If patient is at the pharmacy, call can be transferred to refill team.   1. Which medications need to be refilled? (please list name of each medication and dose if known) furosemide (LASIX) 40 MG tablet 2 times a day  2. Which pharmacy/location (including street and city if local pharmacy) is medication to be sent to? [facility] in [place]  3. Do they need a 30 day or 90 day supply? 30 day

## 2017-03-13 NOTE — Telephone Encounter (Signed)
Current med list has furosemide 40 mg qd listed but  40 mg bid is being requested. Please advise. Thanks, MI

## 2017-03-14 ENCOUNTER — Other Ambulatory Visit: Payer: Self-pay | Admitting: *Deleted

## 2017-03-14 MED ORDER — FUROSEMIDE 40 MG PO TABS: 80.0000 mg | ORAL_TABLET | Freq: Every day | ORAL | 3 refills | Status: AC

## 2017-03-14 NOTE — Telephone Encounter (Signed)
Patient has refills at 40 mg qd. Per 11/23/16 hospital admission, it was changed to two 40 mg tabs qd. Please advise. Thanks, MI

## 2017-03-16 ENCOUNTER — Other Ambulatory Visit: Payer: Self-pay | Admitting: Internal Medicine

## 2017-03-20 ENCOUNTER — Telehealth: Payer: Self-pay | Admitting: Internal Medicine

## 2017-03-20 NOTE — Telephone Encounter (Signed)
New Message    1. Are you calling in reference to your FMLA or disability form? no  2. What is your question in regards to FMLA or disability form? None    3. Do you need copies of your medical records? yes  4. Are you waiting on a nurse to call you back with results or are you wanting copies of your results? No  [name] from [facility] is calling in reference to the release of medical records. She states that she did not receive the fax from 2/08, Can you please resend to [phone].    Please route to Medical Records or your medical records site representative

## 2017-03-20 NOTE — Telephone Encounter (Signed)
Records refaxed to Dept [facility] @ [phone] Manually faxed these records

## 2017-03-21 NOTE — Telephone Encounter (Signed)
Call received from Ms Hilma Favors NP for [facility].  C/o not receiving any progress notes as requested and no indication for Pt continued need for warfarin.  MR to resend progress notes dating back to 2015, time of Pt with apical thrombus.  Will discuss with Dr. Ladona Ridgel continued need for warfarin and notify NP.

## 2017-03-29 NOTE — Telephone Encounter (Signed)
Per Dr. Lynn Ito should continue on warfarin as long as Pt is not having any problems with it (ex bleeding).  The risk of coming off the warfarin is greater than the possible benefit of discontinuing.  Faxed answer to [facility] with [facility].

## 2018-03-10 DIAGNOSIS — I252 Old myocardial infarction: Secondary | ICD-10-CM | POA: Diagnosis not present

## 2018-03-10 DIAGNOSIS — Z7901 Long term (current) use of anticoagulants: Secondary | ICD-10-CM | POA: Diagnosis not present

## 2018-03-10 DIAGNOSIS — I2510 Atherosclerotic heart disease of native coronary artery without angina pectoris: Secondary | ICD-10-CM | POA: Diagnosis not present

## 2018-03-10 DIAGNOSIS — I509 Heart failure, unspecified: Secondary | ICD-10-CM | POA: Diagnosis not present

## 2018-03-10 DIAGNOSIS — Z95810 Presence of automatic (implantable) cardiac defibrillator: Secondary | ICD-10-CM | POA: Diagnosis not present

## 2018-03-10 DIAGNOSIS — R0602 Shortness of breath: Secondary | ICD-10-CM | POA: Diagnosis not present

## 2018-03-10 DIAGNOSIS — Z79899 Other long term (current) drug therapy: Secondary | ICD-10-CM | POA: Diagnosis not present

## 2018-03-10 DIAGNOSIS — R05 Cough: Secondary | ICD-10-CM | POA: Diagnosis not present

## 2018-03-10 DIAGNOSIS — Z7982 Long term (current) use of aspirin: Secondary | ICD-10-CM | POA: Diagnosis not present

## 2018-03-10 DIAGNOSIS — I110 Hypertensive heart disease with heart failure: Secondary | ICD-10-CM | POA: Diagnosis not present

## 2018-03-10 DIAGNOSIS — Z87891 Personal history of nicotine dependence: Secondary | ICD-10-CM | POA: Diagnosis not present

## 2018-03-10 DIAGNOSIS — J441 Chronic obstructive pulmonary disease with (acute) exacerbation: Secondary | ICD-10-CM | POA: Diagnosis not present

## 2018-09-15 DIAGNOSIS — I252 Old myocardial infarction: Secondary | ICD-10-CM | POA: Diagnosis not present

## 2018-09-15 DIAGNOSIS — J449 Chronic obstructive pulmonary disease, unspecified: Secondary | ICD-10-CM | POA: Diagnosis not present

## 2018-09-15 DIAGNOSIS — D72829 Elevated white blood cell count, unspecified: Secondary | ICD-10-CM | POA: Diagnosis not present

## 2018-09-15 DIAGNOSIS — R05 Cough: Secondary | ICD-10-CM | POA: Diagnosis not present

## 2018-09-15 DIAGNOSIS — Z20828 Contact with and (suspected) exposure to other viral communicable diseases: Secondary | ICD-10-CM | POA: Diagnosis not present

## 2018-09-15 DIAGNOSIS — R7303 Prediabetes: Secondary | ICD-10-CM | POA: Diagnosis not present

## 2018-09-15 DIAGNOSIS — Z8546 Personal history of malignant neoplasm of prostate: Secondary | ICD-10-CM | POA: Diagnosis not present

## 2018-09-15 DIAGNOSIS — R0602 Shortness of breath: Secondary | ICD-10-CM | POA: Diagnosis not present

## 2018-09-15 DIAGNOSIS — I4891 Unspecified atrial fibrillation: Secondary | ICD-10-CM | POA: Diagnosis not present

## 2018-09-15 DIAGNOSIS — I2510 Atherosclerotic heart disease of native coronary artery without angina pectoris: Secondary | ICD-10-CM | POA: Diagnosis not present

## 2018-09-15 DIAGNOSIS — I110 Hypertensive heart disease with heart failure: Secondary | ICD-10-CM | POA: Diagnosis not present

## 2018-09-15 DIAGNOSIS — R748 Abnormal levels of other serum enzymes: Secondary | ICD-10-CM | POA: Diagnosis not present

## 2018-09-15 DIAGNOSIS — Z955 Presence of coronary angioplasty implant and graft: Secondary | ICD-10-CM | POA: Diagnosis not present

## 2018-09-15 DIAGNOSIS — I509 Heart failure, unspecified: Secondary | ICD-10-CM | POA: Diagnosis not present

## 2018-09-15 DIAGNOSIS — Z950 Presence of cardiac pacemaker: Secondary | ICD-10-CM | POA: Diagnosis not present

## 2018-09-15 DIAGNOSIS — I700 Atherosclerosis of aorta: Secondary | ICD-10-CM | POA: Diagnosis not present

## 2018-11-26 DIAGNOSIS — R0602 Shortness of breath: Secondary | ICD-10-CM | POA: Diagnosis not present

## 2019-05-07 DEATH — deceased
